# Patient Record
Sex: Female | Born: 1975 | Race: White | Hispanic: Yes | Smoking: Never smoker
Health system: Southern US, Community
[De-identification: ages and names within clinical notes are randomized; demographics above are authoritative.]

## PROBLEM LIST (undated history)

## (undated) HISTORY — PX: APPENDECTOMY: SHX54

---

## 2007-02-05 ENCOUNTER — Ambulatory Visit: Payer: Self-pay | Admitting: Internal Medicine

## 2007-02-07 ENCOUNTER — Ambulatory Visit (HOSPITAL_COMMUNITY): Admission: RE | Admit: 2007-02-07 | Discharge: 2007-02-07 | Payer: Self-pay | Admitting: *Deleted

## 2007-02-07 ENCOUNTER — Ambulatory Visit: Payer: Self-pay | Admitting: *Deleted

## 2007-03-22 ENCOUNTER — Ambulatory Visit: Payer: Self-pay | Admitting: Internal Medicine

## 2007-04-02 ENCOUNTER — Encounter (INDEPENDENT_AMBULATORY_CARE_PROVIDER_SITE_OTHER): Payer: Self-pay | Admitting: Internal Medicine

## 2007-04-02 LAB — CONVERTED CEMR LAB

## 2007-04-08 ENCOUNTER — Ambulatory Visit: Payer: Self-pay | Admitting: Internal Medicine

## 2007-04-09 ENCOUNTER — Ambulatory Visit: Payer: Self-pay | Admitting: Internal Medicine

## 2007-04-09 ENCOUNTER — Encounter (INDEPENDENT_AMBULATORY_CARE_PROVIDER_SITE_OTHER): Payer: Self-pay | Admitting: Internal Medicine

## 2007-04-26 ENCOUNTER — Ambulatory Visit (HOSPITAL_COMMUNITY): Admission: RE | Admit: 2007-04-26 | Discharge: 2007-04-26 | Payer: Self-pay | Admitting: Internal Medicine

## 2007-05-15 ENCOUNTER — Encounter (INDEPENDENT_AMBULATORY_CARE_PROVIDER_SITE_OTHER): Payer: Self-pay | Admitting: Nurse Practitioner

## 2007-05-15 ENCOUNTER — Ambulatory Visit: Payer: Self-pay | Admitting: Internal Medicine

## 2007-05-15 LAB — CONVERTED CEMR LAB
Free T4: 1.09 ng/dL (ref 0.89–1.80)
TSH: 4.995 microintl units/mL (ref 0.350–5.50)
Thyroperoxidase Ab SerPl-aCnc: 5315.7 — ABNORMAL HIGH (ref 0.0–60.0)

## 2007-05-21 ENCOUNTER — Encounter (INDEPENDENT_AMBULATORY_CARE_PROVIDER_SITE_OTHER): Payer: Self-pay | Admitting: Internal Medicine

## 2007-05-21 DIAGNOSIS — E039 Hypothyroidism, unspecified: Secondary | ICD-10-CM | POA: Insufficient documentation

## 2007-05-22 ENCOUNTER — Telehealth (INDEPENDENT_AMBULATORY_CARE_PROVIDER_SITE_OTHER): Payer: Self-pay | Admitting: Internal Medicine

## 2007-06-07 ENCOUNTER — Telehealth (INDEPENDENT_AMBULATORY_CARE_PROVIDER_SITE_OTHER): Payer: Self-pay | Admitting: Internal Medicine

## 2007-06-19 ENCOUNTER — Ambulatory Visit: Payer: Self-pay | Admitting: Obstetrics and Gynecology

## 2007-06-19 ENCOUNTER — Encounter (INDEPENDENT_AMBULATORY_CARE_PROVIDER_SITE_OTHER): Payer: Self-pay | Admitting: Internal Medicine

## 2008-02-27 ENCOUNTER — Ambulatory Visit: Payer: Self-pay | Admitting: Internal Medicine

## 2008-02-27 DIAGNOSIS — E049 Nontoxic goiter, unspecified: Secondary | ICD-10-CM | POA: Insufficient documentation

## 2008-02-27 DIAGNOSIS — N840 Polyp of corpus uteri: Secondary | ICD-10-CM

## 2008-03-03 LAB — CONVERTED CEMR LAB: TSH: 8.34 microintl units/mL — ABNORMAL HIGH (ref 0.350–5.50)

## 2008-07-07 ENCOUNTER — Ambulatory Visit: Payer: Self-pay | Admitting: Internal Medicine

## 2008-07-07 DIAGNOSIS — R109 Unspecified abdominal pain: Secondary | ICD-10-CM | POA: Insufficient documentation

## 2008-07-12 LAB — CONVERTED CEMR LAB
Chlamydia, DNA Probe: NEGATIVE
GC Probe Amp, Genital: NEGATIVE
TSH: 4.144 u[IU]/mL

## 2008-07-13 ENCOUNTER — Encounter (INDEPENDENT_AMBULATORY_CARE_PROVIDER_SITE_OTHER): Payer: Self-pay | Admitting: Internal Medicine

## 2008-08-04 ENCOUNTER — Encounter (INDEPENDENT_AMBULATORY_CARE_PROVIDER_SITE_OTHER): Payer: Self-pay | Admitting: *Deleted

## 2008-08-28 ENCOUNTER — Ambulatory Visit: Payer: Self-pay | Admitting: Internal Medicine

## 2008-08-28 DIAGNOSIS — F329 Major depressive disorder, single episode, unspecified: Secondary | ICD-10-CM | POA: Insufficient documentation

## 2008-08-28 LAB — CONVERTED CEMR LAB: TSH: 3.823 microintl units/mL (ref 0.350–4.50)

## 2008-09-10 ENCOUNTER — Encounter (INDEPENDENT_AMBULATORY_CARE_PROVIDER_SITE_OTHER): Payer: Self-pay | Admitting: Internal Medicine

## 2008-12-24 ENCOUNTER — Ambulatory Visit: Payer: Self-pay | Admitting: Internal Medicine

## 2009-01-15 LAB — CONVERTED CEMR LAB
Free T4: 1.07 ng/dL (ref 0.89–1.80)
TSH: 3.868 microintl units/mL (ref 0.350–4.500)

## 2009-05-25 ENCOUNTER — Telehealth (INDEPENDENT_AMBULATORY_CARE_PROVIDER_SITE_OTHER): Payer: Self-pay | Admitting: Internal Medicine

## 2009-08-13 ENCOUNTER — Emergency Department (HOSPITAL_COMMUNITY): Admission: EM | Admit: 2009-08-13 | Discharge: 2009-08-13 | Payer: Self-pay | Admitting: Family Medicine

## 2010-04-07 ENCOUNTER — Ambulatory Visit: Payer: Self-pay | Admitting: Internal Medicine

## 2010-04-07 LAB — CONVERTED CEMR LAB
ALT: 11 units/L (ref 0–35)
AST: 15 units/L (ref 0–37)
Alkaline Phosphatase: 56 units/L (ref 39–117)
BUN: 15 mg/dL (ref 6–23)
Basophils Absolute: 0 10*3/uL (ref 0.0–0.1)
Basophils Relative: 0 % (ref 0–1)
Blood in Urine, dipstick: NEGATIVE
Calcium: 9.1 mg/dL (ref 8.4–10.5)
Chlamydia, DNA Probe: NEGATIVE
Chloride: 103 meq/L (ref 96–112)
Creatinine, Ser: 0.72 mg/dL (ref 0.40–1.20)
Eosinophils Absolute: 0.1 10*3/uL (ref 0.0–0.7)
Eosinophils Relative: 2 % (ref 0–5)
HDL: 58 mg/dL (ref >39)
Hemoglobin: 12.7 g/dL (ref 12.0–15.0)
KOH Prep: NEGATIVE
MCHC: 33.1 g/dL (ref 30.0–36.0)
MCV: 92.1 fL (ref 78.0–100.0)
Monocytes Absolute: 0.9 10*3/uL (ref 0.1–1.0)
Monocytes Relative: 11 % (ref 3–12)
Neutro Abs: 4.6 10*3/uL (ref 1.7–7.7)
Nitrite: NEGATIVE
RBC: 4.17 M/uL (ref 3.87–5.11)
RDW: 12.9 % (ref 11.5–15.5)
TSH: 4.313 microintl units/mL (ref 0.350–4.500)
Total Bilirubin: 0.5 mg/dL (ref 0.3–1.2)
Total CHOL/HDL Ratio: 2.7
Urobilinogen, UA: 1
VLDL: 31 mg/dL (ref 0–40)
WBC Urine, dipstick: NEGATIVE
Whiff Test: NEGATIVE

## 2010-04-22 ENCOUNTER — Encounter (INDEPENDENT_AMBULATORY_CARE_PROVIDER_SITE_OTHER): Payer: Self-pay | Admitting: Internal Medicine

## 2010-10-16 ENCOUNTER — Encounter: Payer: Self-pay | Admitting: Internal Medicine

## 2010-10-25 NOTE — Progress Notes (Signed)
Summary: Office Visit/DEPRESSION SCREENING  Office Visit/DEPRESSION SCREENING   Imported By: Arta Bruce 04/08/2010 14:51:17  _____________________________________________________________________  External Attachment:    Type:   Image     Comment:   External Document

## 2010-10-25 NOTE — Assessment & Plan Note (Signed)
Summary: *CPP/////KT   Vital Signs:  Patient profile:   35 year old Margaret [name] LMP:     03/31/2010 Weight:      130 pounds BMI:     21.55 Temp:     98.1 degrees F Pulse rate:   77 / minute Pulse rhythm:   regular Resp:     18 per minute BP sitting:   107 / 68  (left arm) Cuff size:   regular  Vitals Entered By: Vesta Mixer CMA (April 07, 2010 2:28 PM) CC: CPP Is Patient Diabetic? No Pain Assessment Patient in pain? no       Does patient need assistance? Ambulation Normal LMP (date): 03/31/2010     Enter LMP: 03/31/2010 Last PAP Result Done   CC:  CPP.  History of Present Illness: 35 yo Margaret [name] here for CPP.  Concerns:  None  Allergies (verified): No Known Drug Allergies  Past History:  Past Medical History: Reviewed history from 05/21/2007 and no changes required. Hypothyroidism Ectopic Pregnancy 2006 Possible endometrial polyp by ultrasound 8/08-gyn apt 9/08 history Hashimoto's thyroiditis   Past Surgical History: 1. Salpingectomy/ovarian cystectomy 2006 2. Appendectomy 35 yrs old.  Family History: Mother, 41:  Gastritis, hypertension Father, 97:  Healthy 11 siblings:  one of sisters is diabetic. 2 children, ages 36 and Margaret yo:  both healthy  Social History: Married Lives at home with husband and 2 children Works at [employer]. Has lived in [place] years. Originally from [place] Tobacco:  None/never Drug:  None/never Alcohol:  none  Review of Systems General:  Energy is good. Eyes:  Previously used glasses--does not wear.. ENT:  Denies decreased hearing. CV:  Denies chest pain or discomfort and palpitations. Resp:  Denies shortness of breath. GI:  Denies abdominal pain, bloody stools, constipation, dark tarry stools, and diarrhea. GU:  Has had some vaginal itching, no discharge, some burning, no odor--3 months.  Has applied different creams--yeast infection creams--did help, but did not resolve issue Periods regular.   Not  using birth control. MS:  Denies joint pain, joint redness, and joint swelling. Derm:  Denies lesion(s) and rash. Neuro:  Denies numbness, tingling, and weakness. Psych:  Denies anxiety and depression; PHQ 9 scored 2.  Physical Exam  General:  Well-developed,well-nourished,in no acute distress; alert,appropriate and cooperative throughout examination Head:  Normocephalic and atraumatic without obvious abnormalities. No apparent alopecia or balding. Eyes:  No corneal or conjunctival inflammation noted. EOMI. Perrla. Funduscopic exam benign, without hemorrhages, exudates or papilledema. Vision grossly normal. Ears:  External ear exam shows no significant lesions or deformities.  Otoscopic examination reveals clear canals, tympanic membranes are intact bilaterally without bulging, retraction, inflammation or discharge. Hearing is grossly normal bilaterally. Nose:  External nasal examination shows no deformity or inflammation. Nasal mucosa are pink and moist without lesions or exudates. Mouth:  Oral mucosa and oropharynx without lesions or exudates.  Teeth in good repair. Neck:  No deformities, masses, or tenderness noted.thyromegaly.   Chest Wall:  No deformities, masses, or tenderness noted. Breasts:  No mass, nodules, thickening, tenderness, bulging, retraction, inflamation, nipple discharge or skin changes noted.   Lungs:  Normal respiratory effort, chest expands symmetrically. Lungs are clear to auscultation, no crackles or wheezes. Heart:  Normal rate and regular rhythm. S1 and S2 normal without gallop, murmur, click, rub or other extra sounds. Abdomen:  Bowel sounds positive,abdomen soft and non-tender without masses, organomegaly or hernias noted. Genitalia:  Pelvic Exam:        External: normal Margaret [name]  genitalia without lesions or masses        Vagina: normal without lesions or masses        Cervix: normal without lesions or masses        Adnexa: normal bimanual exam without masses or  fullness        Uterus: normal by palpation        Pap smear: performed Msk:  No deformity or scoliosis noted of thoracic or lumbar spine.   Pulses:  R and L carotid,radial,femoral,dorsalis pedis and posterior tibial pulses are full and equal bilaterally Extremities:  No clubbing, cyanosis, edema, or deformity noted with normal full range of motion of all joints.   Neurologic:  No cranial nerve deficits noted. Station and gait are normal. Plantar reflexes are down-going bilaterally. DTRs are symmetrical throughout. Sensory, motor and coordinative functions appear intact. Skin:  Intact without suspicious lesions or rashes Cervical Nodes:  No lymphadenopathy noted Axillary Nodes:  No palpable lymphadenopathy Inguinal Nodes:  No significant adenopathy Psych:  Cognition and judgment appear intact. Alert and cooperative with normal attention span and concentration. No apparent delusions, illusions, hallucinations   Impression & Recommendations:  Problem # 1:  ROUTINE GYNECOLOGICAL EXAMINATION (ICD-V72.31)  Orders: UA Dipstick w/o Micro (manual) ([address]) KOH/ WET Mount [phone]) Pap Smear, Thin Prep ( Collection of) [phone]) T- GC Chlamydia (91478) T-HIV Antibody  (Reflex) ([id number]) T-Pap Smear, Thin Prep (57846) T-Syphilis Test (RPR) (96295-28413)  Problem # 2:  HEALTH SCREENING (ICD-V70.0) Encouraged 1 more serving of dairy daily Tdap today Orders: T-Comprehensive Metabolic Panel [phone]) T-CBC w/Diff ([id number]) T-Lipid Profile (25956-38756)  Problem # 3:  THYROMEGALY (ICD-240.9)  Orders: T-TSH ([id number])  Other Orders: State-TD Vaccine 7 yrs. & > IM (16606T) Admin 1st Vaccine (01601) Admin 1st Vaccine [facility]) (09323F)  Patient Instructions: 1)  Call for CPP ibn 1 year with Dr. Delrae Alfred  Preventive Care Screening  Prior Values:    Pap Smear:  Done (04/02/2007)     Immunizations:  cannot recall last Td. TDD:UKGURKY--HC changes Mammogram:   never Osteoprevention:  3 servings of milk and yogurt daily.  Feels she is physically active daily   Laboratory Results   Urine Tests    Routine Urinalysis   Glucose: negative   (Normal Range: Negative) Bilirubin: negative   (Normal Range: Negative) Ketone: trace (5)   (Normal Range: Negative) Spec. Gravity: 1.020   (Normal Range: 1.003-1.035) Blood: negative   (Normal Range: Negative) pH: 6.0   (Normal Range: 5.0-8.0) Protein: negative   (Normal Range: Negative) Urobilinogen: 1.0   (Normal Range: 0-1) Nitrite: negative   (Normal Range: Negative) Leukocyte Esterace: negative   (Normal Range: Negative)      Wet Mount/KOH Source: vaginal WBC/hpf: 1-5 Bacteria/hpf: 1+ Clue cells/hpf: few  Negative whiff Yeast/hpf: none Trichomonas/hpf: none   Laboratory Results   Urine Tests    Routine Urinalysis   Glucose: negative   (Normal Range: Negative) Bilirubin: negative   (Normal Range: Negative) Ketone: trace (5)   (Normal Range: Negative) Spec. Gravity: 1.020   (Normal Range: 1.003-1.035) Blood: negative   (Normal Range: Negative) pH: 6.0   (Normal Range: 5.0-8.0) Protein: negative   (Normal Range: Negative) Urobilinogen: 1.0   (Normal Range: 0-1) Nitrite: negative   (Normal Range: Negative) Leukocyte Esterace: negative   (Normal Range: Negative)      Wet Mount  Negative whiff Wet Mount KOH: Negative     Tetanus/Td Vaccine    Vaccine Type: Tdap [facility])    Site: right deltoid  Mfr: Sanofi Pasteur    Dose: 0.5 ml    Route: IM    Given by: Vesta Mixer CMA    Exp. Date: 09/07/2012    Lot #: [id number]    VIS given: 08/13/07 version given April 07, 2010.

## 2010-10-25 NOTE — Letter (Signed)
Summary: Lipid Letter  [facility]  [address] [place], [place] [address]   Phone: [phone]  Fax: [phone]    04/22/2010  Margaret [name] [address] [place], [place]  [address]  Dear Byrd Hesselbach:  We have carefully reviewed your last lipid profile from 04/07/2010 and the results are noted below with a summary of recommendations for lipid management.    Cholesterol:       154     Goal: <200   HDL "good" Cholesterol:   58     Goal: >45   LDL "bad" Cholesterol:   65     Goal: <100   Triglycerides:       153     Goal: <150    Cholesterol is fine, thyroid testing fine.  Pap was normal.  REst of labs okay too    TLC Diet (Therapeutic Lifestyle Change): Saturated Fats & Transfatty acids should be kept < 7% of total calories ***Reduce Saturated Fats Polyunstaurated Fat can be up to 10% of total calories Monounsaturated Fat Fat can be up to 20% of total calories Total Fat should be no greater than 25-35% of total calories Carbohydrates should be 50-60% of total calories Protein should be approximately 15% of total calories Fiber should be at least 20-30 grams a day ***Increased fiber may help lower LDL Total Cholesterol should be < 200mg /day Consider adding plant stanol/sterols to diet (example: Benacol spread) ***A higher intake of unsaturated fat may reduce Triglycerides and Increase HDL    Adjunctive Measures (may lower LIPIDS and reduce risk of Heart Attack) include: Aerobic Exercise (20-30 minutes 3-4 times a week) Limit Alcohol Consumption Weight Reduction Aspirin 75-81 mg a day by mouth (if not allergic or contraindicated) Dietary Fiber 20-30 grams a day by mouth     Current Medications:  None If you have any questions, please call. We appreciate being able to work with you.   Sincerely,    [facility] Julieanne Manson MD

## 2013-04-11 ENCOUNTER — Ambulatory Visit: Payer: Self-pay

## 2013-05-12 ENCOUNTER — Ambulatory Visit: Payer: Self-pay | Attending: Internal Medicine

## 2013-05-22 ENCOUNTER — Other Ambulatory Visit (HOSPITAL_COMMUNITY)
Admission: RE | Admit: 2013-05-22 | Discharge: 2013-05-22 | Disposition: A | Discharge status: Home / Self Care | Payer: No Typology Code available for payment source | Source: Ambulatory Visit | Attending: Internal Medicine | Admitting: Internal Medicine

## 2013-05-22 ENCOUNTER — Ambulatory Visit: Payer: No Typology Code available for payment source | Attending: Internal Medicine | Admitting: Internal Medicine

## 2013-05-22 VITALS — BP 114/57 | HR 84 | Temp 97.8°F | Resp 17 | Wt 133.0 lb

## 2013-05-22 DIAGNOSIS — Z1151 Encounter for screening for human papillomavirus (HPV): Secondary | ICD-10-CM | POA: Insufficient documentation

## 2013-05-22 DIAGNOSIS — Z0000 Encounter for general adult medical examination without abnormal findings: Secondary | ICD-10-CM

## 2013-05-22 DIAGNOSIS — Z113 Encounter for screening for infections with a predominantly sexual mode of transmission: Secondary | ICD-10-CM | POA: Insufficient documentation

## 2013-05-22 DIAGNOSIS — Z01419 Encounter for gynecological examination (general) (routine) without abnormal findings: Secondary | ICD-10-CM | POA: Insufficient documentation

## 2013-05-22 DIAGNOSIS — N946 Dysmenorrhea, unspecified: Secondary | ICD-10-CM

## 2013-05-22 DIAGNOSIS — N760 Acute vaginitis: Secondary | ICD-10-CM | POA: Insufficient documentation

## 2013-05-22 DIAGNOSIS — Z124 Encounter for screening for malignant neoplasm of cervix: Secondary | ICD-10-CM

## 2013-05-22 LAB — CBC WITH DIFFERENTIAL/PLATELET
Basophils Absolute: 0 10*3/uL (ref 0.0–0.1)
HCT: 37.1 % (ref 36.0–46.0)
Lymphocytes Relative: 29 % (ref 12–46)
Lymphs Abs: 1.6 10*3/uL (ref 0.7–4.0)
Neutro Abs: 3.1 10*3/uL (ref 1.7–7.7)
Platelets: 234 10*3/uL (ref 150–400)
RBC: 4.19 MIL/uL (ref 3.87–5.11)
RDW: 13.9 % (ref 11.5–15.5)
WBC: 5.5 10*3/uL (ref 4.0–10.5)

## 2013-05-22 LAB — LIPID PANEL
HDL: 51 mg/dL (ref >39)
LDL Cholesterol: 69 mg/dL (ref 0–99)
Total CHOL/HDL Ratio: 2.9 Ratio
VLDL: 29 mg/dL (ref 0–40)

## 2013-05-22 LAB — CMP AND LIVER
Albumin: 4.4 g/dL (ref 3.5–5.2)
Alkaline Phosphatase: 64 U/L (ref 39–117)
BUN: 13 mg/dL (ref 6–23)
Calcium: 9.2 mg/dL (ref 8.4–10.5)
Creat: 0.74 mg/dL (ref 0.50–1.10)
Glucose, Bld: 83 mg/dL (ref 70–99)
Indirect Bilirubin: 0.4 mg/dL (ref 0.0–0.9)
Potassium: 4.1 mEq/L (ref 3.5–5.3)

## 2013-05-22 NOTE — Progress Notes (Signed)
Patient here to establish  Care and to get a pap smear

## 2013-05-22 NOTE — Addendum Note (Signed)
Addended by: Lestine Mount on: 05/22/2013 02:08 PM   Modules accepted: Orders

## 2013-05-22 NOTE — Progress Notes (Signed)
Patient ID: Margaret [name], female   DOB: 12-31-1975, 37 y.o.   MRN: [id number]  CC: To Establish care  HPI: Margaret [name] is a 37 years old woman who came in today to establish medical care. She has no complaint today, she just wants a general physical checkup and Pap smear done. No significant family history except for hypertension in mother. She does not smoke cigarette, she does not drink alcohol.   No Known Allergies History reviewed. No pertinent past medical history. No current outpatient prescriptions on file prior to visit.   No current facility-administered medications on file prior to visit.   History reviewed. No pertinent family history. History   Social History  . Marital Status: Married    Spouse Name: N/A    Number of Children: N/A  . Years of Education: N/A   Occupational History  . Not on file.   Social History Main Topics  . Smoking status: Not on file  . Smokeless tobacco: Not on file  . Alcohol Use: Not on file  . Drug Use: Not on file  . Sexual Activity: Not on file   Other Topics Concern  . Not on file   Social History Narrative  . No narrative on file    Review of Systems: Constitutional: Negative for fever, chills, diaphoresis, activity change, appetite change and fatigue. HENT: Negative for ear pain, nosebleeds, congestion, facial swelling, rhinorrhea, neck pain, neck stiffness and ear discharge.  Eyes: Negative for pain, discharge, redness, itching and visual disturbance. Respiratory: Negative for cough, choking, chest tightness, shortness of breath, wheezing and stridor.  Cardiovascular: Negative for chest pain, palpitations and leg swelling. Gastrointestinal: Negative for abdominal distention. Genitourinary: Negative for dysuria, urgency, frequency, hematuria, flank pain, decreased urine volume, difficulty urinating and dyspareunia.  Musculoskeletal: Negative for back pain, joint swelling, arthralgias and gait problem. Neurological: Negative for  dizziness, tremors, seizures, syncope, facial asymmetry, speech difficulty, weakness, light-headedness, numbness and headaches.  Hematological: Negative for adenopathy. Does not bruise/bleed easily. Psychiatric/Behavioral: Negative for hallucinations, behavioral problems, confusion, dysphoric mood, decreased concentration and agitation.    Objective:   Filed Vitals:   05/22/13 1029  BP: 114/57  Pulse: 84  Temp: 97.8 F (36.6 C)  Resp: 17    Physical Exam: Constitutional: Patient appears well-developed and well-nourished. No distress. HENT: Normocephalic, atraumatic, External right and left ear normal. Oropharynx is clear and moist.  Eyes: Conjunctivae and EOM are normal. PERRLA, no scleral icterus. Neck: Normal ROM. Neck supple. No JVD. No tracheal deviation. No thyromegaly. CVS: RRR, S1/S2 +, no murmurs, no gallops, no carotid bruit.  Pulmonary: Effort and breath sounds normal, no stridor, rhonchi, wheezes, rales.  Abdominal: Soft. BS +,  no distension, tenderness, rebound or guarding.  Musculoskeletal: Normal range of motion. No edema and no tenderness.  Lymphadenopathy: No lymphadenopathy noted, cervical, inguinal or axillary Neuro: Alert. Normal reflexes, muscle tone coordination. No cranial nerve deficit. Skin: Skin is warm and dry. No rash noted. Not diaphoretic. No erythema. No pallor. Psychiatric: Normal mood and affect. Behavior, judgment, thought content normal. Pelvic exam: Normal external female genitalia, slightly bulky uterus, central cervix, no cervical motion tenderness Lab Results  Component Value Date   WBC 7.5 04/07/2010   HGB 12.7 04/07/2010   HCT 38.4 04/07/2010   MCV 92.1 04/07/2010   PLT 219 04/07/2010   Lab Results  Component Value Date   CREATININE 0.72 04/07/2010   BUN 15 04/07/2010   NA 138 04/07/2010   K 4.2 04/07/2010   CL 103 04/07/2010  CO2 28 04/07/2010    No results found for this basename: HGBA1C   Lipid Panel     Component Value Date/Time    CHOL 154 04/07/2010 2132   TRIG 153* 04/07/2010 2132   HDL 58 04/07/2010 2132   CHOLHDL 2.7 Ratio 04/07/2010 2132   VLDL 31 04/07/2010 2132   LDLCALC 65 04/07/2010 2132       Assessment and plan:   Patient Active Problem List   Diagnosis Date Noted  . Pap smear for cervical cancer screening 05/22/2013  . Dysmenorrhea 05/22/2013  . Annual physical exam 05/22/2013  . DEPRESSION 08/28/2008  . PELVIC PAIN, RIGHT 07/07/2008  . THYROMEGALY 02/27/2008  . ENDOMETRIAL POLYP 02/27/2008  . HYPOTHYROIDISM 05/21/2007   Labs: CBC D Comprehensive metabolic TSH Complete urinalysis Lipid panel  Pap smear done, sent for cytology GC Chlamydia, wet mount     Return in 2 weeks in the clinic to discuss results  Graysen Depaula was given clear instructions to go to ER or return to the clinic if symptoms don't improve, worsen or new problems develop.  Scotty Court verbalized understanding.  Kendalyn Cranfield was told to call to get lab results if hasn't heard anything in the next week.    Interpreter was used to communicate directly with patient for the entire encounter including providing detailed patient instructions.   Jeanann Lewandowsky, MD [facility] [place], [place] [phone]   05/22/2013, 11:19 AM

## 2013-05-23 LAB — URINALYSIS, COMPLETE
Crystals: NONE SEEN
Leukocytes, UA: NEGATIVE
Nitrite: NEGATIVE
Specific Gravity, Urine: 1.023 (ref 1.005–1.030)
Squamous Epithelial / LPF: NONE SEEN
Urobilinogen, UA: 0.2 mg/dL (ref 0.0–1.0)

## 2013-05-23 LAB — TSH: TSH: 5.111 u[IU]/mL — ABNORMAL HIGH (ref 0.350–4.500)

## 2013-05-30 ENCOUNTER — Telehealth: Payer: Self-pay | Admitting: Emergency Medicine

## 2013-05-30 NOTE — Telephone Encounter (Signed)
Message copied by Darlis Loan on Fri May 30, 2013 12:09 PM ------      Message from: Jeanann Lewandowsky E      Created: Wed May 28, 2013 10:19 AM       Please call patient to inform her that her Pap Smear is negative for malignancy and there is no infection. She still has lab findings of hypothyroidism and should continue his medication. If there is any symptom, please report to the clinic immediately. Her lipid panel is within normal limit. Continue diet control and exercise. ------

## 2013-05-30 NOTE — Telephone Encounter (Signed)
LEFT MESSAGETO CALL CLINIC FOR LAB RESULTS

## 2013-06-05 ENCOUNTER — Ambulatory Visit: Payer: No Typology Code available for payment source

## 2013-10-08 ENCOUNTER — Ambulatory Visit: Payer: No Typology Code available for payment source | Attending: Internal Medicine | Admitting: Internal Medicine

## 2013-10-08 ENCOUNTER — Encounter: Payer: Self-pay | Admitting: Internal Medicine

## 2013-10-08 VITALS — BP 113/69 | HR 80 | Temp 98.9°F | Resp 16 | Ht 65.0 in | Wt 134.0 lb

## 2013-10-08 DIAGNOSIS — N390 Urinary tract infection, site not specified: Secondary | ICD-10-CM

## 2013-10-08 DIAGNOSIS — N750 Cyst of Bartholin's gland: Secondary | ICD-10-CM

## 2013-10-08 LAB — POCT URINALYSIS DIPSTICK
BILIRUBIN UA: NEGATIVE
Glucose, UA: NEGATIVE
Ketones, UA: NEGATIVE
NITRITE UA: NEGATIVE
PH UA: 6.5
Protein, UA: NEGATIVE
Spec Grav, UA: 1.025
Urobilinogen, UA: 0.2

## 2013-10-08 MED ORDER — SULFAMETHOXAZOLE-TMP DS 800-160 MG PO TABS: 1.0000 | ORAL_TABLET | Freq: Two times a day (BID) | ORAL | Status: DC

## 2013-10-08 NOTE — Patient Instructions (Signed)
Absceso o quiste de Bartolino  (Bartholin's Cyst or Abscess)  Las glándulas de Bartolino son glándulas pequeñas ubicadas dentro de los pliegues de la piel (labios) a los lados de la apertura de la vagina (canal del parto). Cuando el conducto de la glándula se obstruye, puede desarrollarse un quiste. Cuando esto ocurre, el líquido que se acumula dentro del quiste puede llegar a infectarse. Esto se conoce como absceso. La glándula de Bartolino produce una mucosidad líquida en la parte externa de la vagina durante las relaciones sexuales.  SÍNTOMAS  · Los pacientes que presentan un quiste pequeño no tienen problemas.  · Podrá sentir desde una leve molestia a un dolor intenso según el tamaño del quiste y si existe infección o no,  · Sentirá dolor, inflamación e hinchazón en la zona inferior de la vagina.  · Dolor en las relaciones sexuales.  · Presión en las zona del perineo.  · Hinchazón de los labios de la vagina.  · El quiste puede estar en uno o ambos lados de la vagina.  DIAGNÓSTICO  · El profesional podrá observar una gran zona hinchada en la parte inferior de la vagina.  · Es una zona dolorosa al tacto.  · Si se trata de un absceso habrá inflamación y dolor.  TRATAMIENTO  · En algunos casos el quiste desaparecerá sin tratamiento.  · Aplique compresas tibias húmedas en la zona o tome baños de asiento varias veces al día.  · Le practicarán una incisión para drenar el quiste o el absceso, previa aplicación de anestesia local.  · Si se trata de un absceso le indicarán un cultivo del pus.  · Y en ese caso le prescribirán un tratamiento con antibióticos.  · Se realizará una abertura en la glándula, suturando los bordes para hacer la abertura más grande (marsupialización).  · Si aparece nuevamente el quiste o el absceso, le extirparán toda la glándula.  PREVENCIÓN  · Mantenga una buena higiene.  · Higienice la zona vaginal con jabón neutro y un paño suave.  · No frote la zona al darse un baño.  · Proteja la zona de la  entrepierna con un apósito si realiza largos paseos en bicicleta o a caballo.  · Asegúrese de estar bien lubricada cuando mantenga relaciones sexuales.  INSTRUCCIONES PARA EL CUIDADO DOMICILIARIO  · Si su quiste o absceso ha sido abierto, pudieran haberle colocado un pequeño trozo de gasa o un drenaje para permitir que la herida supure. La gasa o el drenaje a menos que se lo indique el profesional que le asiste.  · Use toallas femeninas y no tampones cuando lo necesite en caso de drenaje o sangrado.  · Si le han recetado medicamentos que combaten los gérmenes (antibióticos ), tómelos exactamente de la manera que le haya sido indicada. Asegúrese de terminar con todo el ciclo de antibióticos.  · Utilice los medicamentos de venta libre o de prescripción para el dolor, el malestar o la fiebre, según se lo indique el profesional que lo asiste.  SOLICITE ATENCIÓN MÉDICA DE INMEDIATO SI:  · Aumenta el dolor, el enrojecimiento, la hinchazón o la supuración.  · La herida ha sangrado al punto que ha debido usar más apósitos de los que la cantidad de apósitos sugerida por el médico en 24 horas.  · Siente escalofríos.  · Tiene fiebre.  · Tiene algún problema (síntoma) nuevo o se agravan lo ya existentes.  ESTÉ SEGURO QUE:  · Comprende las instrucciones para el alta médica.  · Controlará su 

## 2013-10-08 NOTE — Progress Notes (Signed)
Patient ID: Margaret [name], female   DOB: 1976/07/17, 38 y.o.   MRN: [id number]  CC: ? cyst  HPI: 38 year old female with no significant past medical history who presented to clinic for evaluation of questionable cyst/abscess on labia majora. Patient reports in [place] she had a laser surgery to remove it couple of years ago. She usually notices data with regular menstruation because of. She doesn't particularly complain of burning sensation on urination. She does feel some pain in that area. No fevers or chills  No Known Allergies History reviewed. No pertinent past medical history. No current outpatient prescriptions on file prior to visit.   No current facility-administered medications on file prior to visit.   History reviewed. No pertinent family history. History   Social History  . Marital Status: Married    Spouse Name: N/A    Number of Children: N/A  . Years of Education: N/A   Occupational History  . Not on file.   Social History Main Topics  . Smoking status: Never Smoker   . Smokeless tobacco: Not on file  . Alcohol Use: No  . Drug Use: No  . Sexual Activity: Yes   Other Topics Concern  . Not on file   Social History Narrative  . No narrative on file    Review of Systems  Constitutional: Negative for fever, chills, diaphoresis, activity change, appetite change and fatigue.  HENT: Negative for ear pain, nosebleeds, congestion, facial swelling, rhinorrhea, neck pain, neck stiffness and ear discharge.   Eyes: Negative for pain, discharge, redness, itching and visual disturbance.  Respiratory: Negative for cough, choking, chest tightness, shortness of breath, wheezing and stridor.   Cardiovascular: Negative for chest pain, palpitations and leg swelling.  Gastrointestinal: Negative for abdominal distention.  Genitourinary: Has a small questionable abscess on lanbia majora Musculoskeletal: Negative for back pain, joint swelling, arthralgias and gait problem.   Neurological: Negative for dizziness, tremors, seizures, syncope, facial asymmetry, speech difficulty, weakness, light-headedness, numbness and headaches.  Hematological: Negative for adenopathy. Does not bruise/bleed easily.  Psychiatric/Behavioral: Negative for hallucinations, behavioral problems, confusion, dysphoric mood, decreased concentration and agitation.    Objective:   Filed Vitals:   10/08/13 1311  BP: 113/69  Pulse: 80  Temp: 98.9 F (37.2 C)  Resp: 16    Physical Exam  Constitutional: Appears well-developed and well-nourished. No distress.  HENT: Normocephalic. External right and left ear normal. Oropharynx is clear and moist.  Eyes: Conjunctivae and EOM are normal. PERRLA, no scleral icterus.  Neck: Normal ROM. Neck supple. No JVD. No tracheal deviation. No thyromegaly.  CVS: RRR, S1/S2 +, no murmurs, no gallops, no carotid bruit.  Pulmonary: Effort and breath sounds normal, no stridor, rhonchi, wheezes, rales.  Abdominal: Soft. BS +,  no distension, tenderness, rebound or guarding.  Musculoskeletal: Normal range of motion. No edema and no tenderness.  Lymphadenopathy: No lymphadenopathy noted, cervical, inguinal. Neuro: Alert. Normal reflexes, muscle tone coordination. No cranial nerve deficit. Skin: Skin is warm and dry. No rash noted. Not diaphoretic. No erythema. No pallor.  Psychiatric: Normal mood and affect. Behavior, judgment, thought content normal.   Lab Results  Component Value Date   WBC 5.5 05/22/2013   HGB 12.6 05/22/2013   HCT 37.1 05/22/2013   MCV 88.5 05/22/2013   PLT 234 05/22/2013   Lab Results  Component Value Date   CREATININE 0.74 05/22/2013   BUN 13 05/22/2013   NA 140 05/22/2013   K 4.1 05/22/2013   CL 107 05/22/2013   CO2  25 05/22/2013    No results found for this basename: HGBA1C   Lipid Panel     Component Value Date/Time   CHOL 149 05/22/2013 1133   TRIG 144 05/22/2013 1133   HDL 51 05/22/2013 1133   CHOLHDL 2.9 05/22/2013 1133    VLDL 29 05/22/2013 1133   LDLCALC 69 05/22/2013 1133       Assessment and plan:   Patient Active Problem List   Diagnosis Date Noted  . Bartholin cyst 10/08/2013    Priority: Medium - Questionable Bartholin's cyst  - Referral made to GYN  - Given prescription for Bactrim for possible UTI

## 2013-10-08 NOTE — Progress Notes (Signed)
Pt C.C. She has had a UTI for 3 weeks.

## 2013-11-17 ENCOUNTER — Encounter: Payer: Self-pay | Admitting: Obstetrics & Gynecology

## 2014-04-21 ENCOUNTER — Emergency Department (HOSPITAL_COMMUNITY): Payer: Self-pay

## 2014-04-21 ENCOUNTER — Encounter (HOSPITAL_COMMUNITY): Payer: Self-pay | Admitting: Emergency Medicine

## 2014-04-21 ENCOUNTER — Emergency Department (HOSPITAL_COMMUNITY)
Admission: EM | Admit: 2014-04-21 | Discharge: 2014-04-21 | Disposition: A | Discharge status: Home / Self Care | Payer: Self-pay | Attending: Emergency Medicine | Admitting: Emergency Medicine

## 2014-04-21 DIAGNOSIS — Y92009 Unspecified place in unspecified non-institutional (private) residence as the place of occurrence of the external cause: Secondary | ICD-10-CM | POA: Insufficient documentation

## 2014-04-21 DIAGNOSIS — S335XXA Sprain of ligaments of lumbar spine, initial encounter: Secondary | ICD-10-CM | POA: Insufficient documentation

## 2014-04-21 DIAGNOSIS — S39012A Strain of muscle, fascia and tendon of lower back, initial encounter: Secondary | ICD-10-CM

## 2014-04-21 DIAGNOSIS — IMO0002 Reserved for concepts with insufficient information to code with codable children: Secondary | ICD-10-CM | POA: Insufficient documentation

## 2014-04-21 DIAGNOSIS — X58XXXA Exposure to other specified factors, initial encounter: Secondary | ICD-10-CM | POA: Insufficient documentation

## 2014-04-21 DIAGNOSIS — Y9389 Activity, other specified: Secondary | ICD-10-CM | POA: Insufficient documentation

## 2014-04-21 DIAGNOSIS — Z3202 Encounter for pregnancy test, result negative: Secondary | ICD-10-CM | POA: Insufficient documentation

## 2014-04-21 DIAGNOSIS — Z79899 Other long term (current) drug therapy: Secondary | ICD-10-CM | POA: Insufficient documentation

## 2014-04-21 LAB — URINALYSIS, ROUTINE W REFLEX MICROSCOPIC
BILIRUBIN URINE: NEGATIVE
Glucose, UA: NEGATIVE mg/dL
HGB URINE DIPSTICK: NEGATIVE
Ketones, ur: NEGATIVE mg/dL
Nitrite: NEGATIVE
Protein, ur: NEGATIVE mg/dL
SPECIFIC GRAVITY, URINE: 1.007 (ref 1.005–1.030)
UROBILINOGEN UA: 0.2 mg/dL (ref 0.0–1.0)
pH: 7.5 (ref 5.0–8.0)

## 2014-04-21 LAB — URINE MICROSCOPIC-ADD ON

## 2014-04-21 LAB — PREGNANCY, URINE: Preg Test, Ur: NEGATIVE

## 2014-04-21 MED ORDER — CYCLOBENZAPRINE HCL 10 MG PO TABS: 10.0000 mg | ORAL_TABLET | Freq: Two times a day (BID) | ORAL | Status: DC | PRN

## 2014-04-21 MED ORDER — DIAZEPAM 5 MG PO TABS
5.0000 mg | ORAL_TABLET | Freq: Once | ORAL | Status: AC
  Administered 2014-04-21: 5 mg via ORAL
  Filled 2014-04-21: qty 1

## 2014-04-21 MED ORDER — HYDROCODONE-ACETAMINOPHEN 5-325 MG PO TABS: 2.0000 | ORAL_TABLET | ORAL | Status: DC | PRN

## 2014-04-21 MED ORDER — KETOROLAC TROMETHAMINE 60 MG/2ML IM SOLN
60.0000 mg | Freq: Once | INTRAMUSCULAR | Status: AC
  Administered 2014-04-21: 60 mg via INTRAMUSCULAR
  Filled 2014-04-21: qty 2

## 2014-04-21 NOTE — Discharge Instructions (Signed)
Take Vicodin for pain. Take Flexeril for muscle spasm. Refer to attached documents for more information. You may take the medications together.

## 2014-04-21 NOTE — ED Notes (Signed)
Declined W/C at D/C and was escorted to lobby by RN. 

## 2014-04-21 NOTE — Discharge Planning (Signed)
[place] to patient regarding primary care resources and establishing care with a provider. Patient states she is seen by [facility] at [facility] and her orange card has recently expired. Pts family at bedside states the pt has an upcoming appointment at the practice. Resource guide and my contact information provided for future questions or concerns. No other needs identified at this time.

## 2014-04-21 NOTE — ED Notes (Signed)
Per family pt was cleaning her house yesterday and started having back pain after moving a table. sts pain also in right leg.

## 2014-04-21 NOTE — ED Notes (Signed)
Called radiology to advise that pregnancy test is back and negative.

## 2014-04-21 NOTE — ED Provider Notes (Signed)
CSN: [id number]     Arrival date & time 04/21/14  1249 History  This chart was scribed for Margaret Chou PA-C working with Hoy Morn, MD by Stacy Gardner, ED scribe. This patient was seen in room TR10C/TR10C and the patient's care was started at 1:57 PM.   First MD Initiated Contact with Patient 04/21/14 1306     Chief Complaint  Patient presents with  . Back Pain     (Consider location/radiation/quality/duration/timing/severity/associated sxs/prior Treatment) HPI HPI Comments: Margaret [name] is a 38 y.o. female who presents to the Emergency Department complaining of back pain, onset yesterday. Pt mentions yesterday while moving a table she had sharp right sided back pian. Pt vomited three day since onset. She had dysuria to her right suprapubic region. Pt took Advil this morning w/o improvement of symptoms. Pt reports when she moves her neck she has pain to her right hip.. Pt had an appendectomy. Denies diarrhea, fever, and chills. She had past back pain three years ago but this is more severe. Pt denies being pregnant.  History reviewed. No pertinent past medical history. Past Surgical History  Procedure Laterality Date  . Appendectomy     History reviewed. No pertinent family history. History  Substance Use Topics  . Smoking status: Never Smoker   . Smokeless tobacco: Not on file  . Alcohol Use: No   OB History   Grav Para Term Preterm Abortions TAB SAB Ect Mult Living                 Review of Systems  Constitutional: Negative for fever, chills and fatigue.  HENT: Negative for trouble swallowing.   Eyes: Negative for visual disturbance.  Respiratory: Negative for shortness of breath.   Cardiovascular: Negative for chest pain and palpitations.  Gastrointestinal: Negative for nausea, vomiting, abdominal pain and diarrhea.  Genitourinary: Negative for dysuria and difficulty urinating.  Musculoskeletal: Positive for back pain. Negative for arthralgias and neck  pain.  Skin: Negative for color change.  Neurological: Negative for dizziness and weakness.  Psychiatric/Behavioral: Negative for dysphoric mood.      Allergies  Review of patient's allergies indicates no known allergies.  Home Medications   Prior to Admission medications   Medication Sig Start Date End Date Taking? Authorizing Provider  ibuprofen (ADVIL,MOTRIN) 200 MG tablet Take 400 mg by mouth every 6 (six) hours as needed (pain).   Yes Historical Provider, MD   BP 109/71  Pulse 86  Temp(Src) 98.5 F (36.9 C) (Oral)  Resp 18  SpO2 97%  LMP 04/07/2014 Physical Exam  Nursing note and vitals reviewed. Constitutional: She is oriented to person, place, and time. She appears well-developed and well-nourished. No distress.  HENT:  Head: Normocephalic and atraumatic.  Eyes: Conjunctivae and EOM are normal.  Neck: Normal range of motion.  Cardiovascular: Normal rate and regular rhythm.  Exam reveals no gallop and no friction rub.   No murmur heard. Pulmonary/Chest: Effort normal and breath sounds normal. She has no wheezes. She has no rales. She exhibits no tenderness.  Abdominal: Soft. There is no tenderness.  Musculoskeletal: Normal range of motion.  No midline spine tenderness to palpation. Right lumbar paraspinal tenderness to palpation.   Neurological: She is alert and oriented to person, place, and time. Coordination normal.  Lower extremity strength and sensation equal and intact bilaterally. Speech is goal-oriented. Moves limbs without ataxia.   Skin: Skin is warm and dry.  Psychiatric: She has a normal mood and affect. Her behavior is normal.  ED Course  Procedures (including critical care time) DIAGNOSTIC STUDIES: Oxygen Saturation is 97% on room air, normal by my interpretation.    COORDINATION OF CARE:  1:56 PM Discussed course of care with pt . Pt understands and agrees.    Labs Review Labs Reviewed - No data to display  Imaging Review Dg Cervical  Spine Complete  04/21/2014   CLINICAL DATA:  Low back pain  EXAM: CERVICAL SPINE  4+ VIEWS  COMPARISON:  None.  FINDINGS: Anatomic alignment. No vertebral compression deformity. Unremarkable prevertebral soft tissues. Oblique views are limited but foramina are grossly patent. No obvious fracture. Disc height is maintained. Minimal posterior osteophytic ridging at the C4-5 and C5-6 discs.  IMPRESSION: No acute abnormality.   Electronically Signed   By: Maryclare Bean M.D.   On: 04/21/2014 16:13   Dg Lumbar Spine Complete  04/21/2014   CLINICAL DATA:  Low back pain for 2 days  EXAM: LUMBAR SPINE - COMPLETE 4+ VIEW  COMPARISON:  08/13/2009  FINDINGS: There is no evidence of lumbar spine fracture. Alignment is normal. Intervertebral disc spaces are maintained.  IMPRESSION: Stable exam.  No acute osseous finding.   Electronically Signed   By: Daryll Brod M.D.   On: 04/21/2014 16:10     EKG Interpretation None      MDM   Final diagnoses:  Lumbar strain, initial encounter    Patient's xrays unremarkable for acute changes. No bladder/bowel incontinence or saddle paresthesias. Vitals stable and patient afebrile. Patient reports relief with IM toradol and valium. Patient will be discharged with Flexeril and Vicodin for pain.   I personally performed the services described in this documentation, which was scribed in my presence. The recorded information has been reviewed and is accurate.    Margaret [name], [place] 04/22/14 [phone]

## 2014-04-27 NOTE — ED Provider Notes (Signed)
Medical screening examination/treatment/procedure(s) were performed by non-physician practitioner and as supervising physician I was immediately available for consultation/collaboration.   EKG Interpretation None        Hoy Morn, MD 04/27/14 2307

## 2014-08-17 ENCOUNTER — Telehealth: Payer: Self-pay | Admitting: Internal Medicine

## 2014-08-17 ENCOUNTER — Ambulatory Visit: Payer: Self-pay

## 2014-08-17 NOTE — Telephone Encounter (Signed)
Pt. Came into facility requesting results from ED visit in July, pt. States that they did not give her results at the hospital and told her to follow up with her PCP to request results. Please f/u with pt.

## 2014-08-19 NOTE — Telephone Encounter (Signed)
Expand All Collapse All   Pt. Came into facility requesting results from ED visit in July, pt. States that they did not give her results at the hospital and told her to follow up with her PCP to request results. Please f/u with pt.

## 2014-09-09 ENCOUNTER — Ambulatory Visit: Payer: Self-pay

## 2014-12-03 ENCOUNTER — Encounter: Payer: Self-pay | Admitting: Internal Medicine

## 2014-12-14 ENCOUNTER — Encounter: Payer: Self-pay | Admitting: Internal Medicine

## 2014-12-14 ENCOUNTER — Ambulatory Visit: Payer: Self-pay | Attending: Internal Medicine | Admitting: Internal Medicine

## 2014-12-14 VITALS — BP 123/74 | HR 78 | Temp 98.7°F | Resp 16 | Ht 65.0 in | Wt 135.0 lb

## 2014-12-14 DIAGNOSIS — Z124 Encounter for screening for malignant neoplasm of cervix: Secondary | ICD-10-CM

## 2014-12-14 DIAGNOSIS — Z0000 Encounter for general adult medical examination without abnormal findings: Secondary | ICD-10-CM

## 2014-12-14 DIAGNOSIS — N852 Hypertrophy of uterus: Secondary | ICD-10-CM

## 2014-12-14 LAB — CBC WITH DIFFERENTIAL/PLATELET
Basophils Absolute: 0 10*3/uL (ref 0.0–0.1)
Basophils Relative: 0 % (ref 0–1)
Eosinophils Absolute: 0.1 10*3/uL (ref 0.0–0.7)
Eosinophils Relative: 1 % (ref 0–5)
HCT: 38.8 % (ref 36.0–46.0)
Hemoglobin: 13 g/dL (ref 12.0–15.0)
Lymphocytes Relative: 28 % (ref 12–46)
Lymphs Abs: 2.4 10*3/uL (ref 0.7–4.0)
MCH: 28.9 pg (ref 26.0–34.0)
MCHC: 33.5 g/dL (ref 30.0–36.0)
MCV: 86.2 fL (ref 78.0–100.0)
MPV: 10.4 fL (ref 8.6–12.4)
Monocytes Absolute: 1.2 10*3/uL — ABNORMAL HIGH (ref 0.1–1.0)
Monocytes Relative: 14 % — ABNORMAL HIGH (ref 3–12)
Neutro Abs: 4.8 10*3/uL (ref 1.7–7.7)
Neutrophils Relative %: 57 % (ref 43–77)
Platelets: 237 10*3/uL (ref 150–400)
RBC: 4.5 MIL/uL (ref 3.87–5.11)
RDW: 14.1 % (ref 11.5–15.5)
WBC: 8.4 10*3/uL (ref 4.0–10.5)

## 2014-12-14 LAB — COMPLETE METABOLIC PANEL WITH GFR
ALT: 19 U/L (ref 0–35)
AST: 20 U/L (ref 0–37)
Albumin: 4.5 g/dL (ref 3.5–5.2)
Alkaline Phosphatase: 62 U/L (ref 39–117)
BUN: 14 mg/dL (ref 6–23)
CO2: 28 mEq/L (ref 19–32)
Calcium: 9 mg/dL (ref 8.4–10.5)
Chloride: 104 mEq/L (ref 96–112)
Creat: 0.65 mg/dL (ref 0.50–1.10)
GFR, Est African American: >89 mL/min
GFR, Est Non African American: >89 mL/min
Glucose, Bld: 87 mg/dL (ref 70–99)
Potassium: 3.4 mEq/L — ABNORMAL LOW (ref 3.5–5.3)
Sodium: 138 mEq/L (ref 135–145)
Total Bilirubin: 0.5 mg/dL (ref 0.2–1.2)
Total Protein: 7.3 g/dL (ref 6.0–8.3)

## 2014-12-14 LAB — LIPID PANEL
Cholesterol: 182 mg/dL (ref 0–200)
HDL: 68 mg/dL (ref >=46)
LDL Cholesterol: 93 mg/dL (ref 0–99)
Total CHOL/HDL Ratio: 2.7 Ratio
Triglycerides: 103 mg/dL (ref <150)
VLDL: 21 mg/dL (ref 0–40)

## 2014-12-14 NOTE — Patient Instructions (Signed)
Ejercicios para Technical sales engineer (Exercise to [employer]) La actividad fsica lo ayudar a estar y [place] sano. IDEAS Y CONSEJOS PARA HACER EJERCICIOS Elija ejercicios que:  Pueda disfrutar.  Pueda acomodarlos en su rutina. No necesita ejercitar demasiado para estar sano. Puede realizar [employer] a un ritmo lento o mediano y [employer]. Usted puede:  Elongar antes y despus de Chief Technology Officer.  Pruebe practicar yoga, Pilates o tai chi.  Levantar pesas.  Caminar rpido, nadar, trotar, correr, subir escaleras, andar en bicicleta, bailar o andar en rollers.  Tomar clases de [place]. Actividad fsicaque puede quemar alrededor de 150 caloras:  Correr 20 cuadras en 15 minutos.  Jugar vley durante 45 a 60 minutos.  Limpiar y encerar el auto durante 45 a 60 minutos.  Jugar ftbol americano de toque.  Caminar 25 cuadras en 35 minutos.  Empujar un cochecito 20 cuadras en 30 minutos.  Jugar baloncesto durante 30 minutos.  Rastrillar hojas secas durante 30 minutos.  Andar en bicicleta 80 cuadras en 30 minutos.  Caminar 30 cuadras en 30 minutos.  Bailar durante 30 minutos.  Quitar la nieve con una pala durante 15 minutos.  Nadar vigorosamente durante 20 minutos.  Subir escaleras durante 15 minutos.  Andar en bicicleta 60 cuadras durante 15 minutos.  Arreglar el jardn entre 30 y 82 minutos.  Saltar a la soga durante 15 minutos.  Limpiar vidrios o pisos durante 45 a 60 minutos. Document Released: 12/16/2010 Document Revised: 12/04/2011 [facility] Patient Information 2015 [place]. This information is not intended to replace advice given to you by your health care provider. Make sure you discuss any questions you have with your health care provider.

## 2014-12-14 NOTE — Progress Notes (Signed)
Pt is here for a physical and a pap smear.

## 2014-12-14 NOTE — Progress Notes (Deleted)
Interpreter line used Patient complains of right hip pain Patient states she has this a long time ago now the pain is back Patient states she has not trouble voiding

## 2014-12-14 NOTE — Progress Notes (Signed)
Patient ID: Margaret [name], female   DOB: 18-Jul-1976, 39 y.o.   MRN: [id number]   Margaret [name], is a 39 y.o. female  [id number]  [id number]  DOB - 07/15/1976  Chief Complaint  Patient presents with  . Follow-up        Subjective:   Margaret [name] is a 39 y.o. female here today for a follow up visit. Patient has no significant past medical history here today for annual physical examination. She has no complaint. Patient has No headache, No chest pain, No abdominal pain - No Nausea, No new weakness tingling or numbness, No Cough - SOB.  No problems updated.  ALLERGIES: No Known Allergies  PAST MEDICAL HISTORY: History reviewed. No pertinent past medical history.  MEDICATIONS AT HOME: Prior to Admission medications   Medication Sig Start Date End Date Taking? Authorizing Provider  cyclobenzaprine (FLEXERIL) 10 MG tablet Take 1 tablet (10 mg total) by mouth 2 (two) times daily as needed for muscle spasms. Patient not taking: Reported on 12/14/2014 04/21/14   Alvina Chou, PA-C  HYDROcodone-acetaminophen (NORCO/VICODIN) 5-325 MG per tablet Take 2 tablets by mouth every 4 (four) hours as needed for moderate pain or severe pain. Patient not taking: Reported on 12/14/2014 04/21/14   Alvina Chou, PA-C  ibuprofen (ADVIL,MOTRIN) 200 MG tablet Take 400 mg by mouth every 6 (six) hours as needed (pain).    Historical Provider, MD     Objective:   Filed Vitals:   12/14/14 1707  BP: 123/74  Pulse: 78  Temp: 98.7 F (37.1 C)  TempSrc: Oral  Resp: 16  Height: 5\' 5"  (1.651 m)  Weight: 135 lb (61.236 kg)  SpO2: 98%    Exam General appearance : Awake, alert, not in any distress. Speech Clear. Not toxic looking HEENT: Atraumatic and Normocephalic, pupils equally reactive to light and accomodation Neck: supple, no JVD. No cervical lymphadenopathy.  Chest:Good air entry bilaterally, no added sounds  CVS: S1 S2 regular, no murmurs.  Abdomen: Bowel sounds  present, Non tender and not distended with no gaurding, rigidity or rebound. Extremities: B/L Lower Ext shows no edema, both legs are warm to touch Neurology: Awake alert, and oriented X 3, CN II-XII intact, Non focal  Pelvic Exam: Cervix normal in appearance, external genitalia normal, no adnexal masses or tenderness, no cervical motion tenderness, rectovaginal septum normal, uterus is bulky, vagina normal without discharge    Data Review No results found for: HGBA1C   Assessment & Plan   1. Annual physical exam  - CBC with Differential/Platelet - COMPLETE METABOLIC PANEL WITH GFR - POCT glycosylated hemoglobin (Hb A1C) - Lipid panel - TSH - Urinalysis, Complete - HIV antibody (with reflex)  2. Pap smear for cervical cancer screening  - Cytology - PAP - Cervicovaginal ancillary only  3. Bulky or enlarged uterus  - US Pelvis Complete; Future - US Transvaginal Non-OB; Future  Patient have been counseled extensively about nutrition and exercise Return in about 1 year (around 12/14/2015), or if symptoms worsen or fail to improve, for Annual Physical.  The patient was given clear instructions to go to ER or return to medical center if symptoms don't improve, worsen or new problems develop. The patient verbalized understanding. The patient was told to call to get lab results if they haven't heard anything in the next week.   This note has been created with Surveyor, quantity. Any transcriptional errors are unintentional.    Margaret Berhow, MD, MHA, CPE, FACP, [place]  [facility] and [place], [place]   12/14/2014, 5:45 PM

## 2014-12-15 LAB — URINALYSIS, COMPLETE
BACTERIA UA: NONE SEEN
Bilirubin Urine: NEGATIVE
Casts: NONE SEEN
Crystals: NONE SEEN
Glucose, UA: NEGATIVE mg/dL
Hgb urine dipstick: NEGATIVE
Ketones, ur: NEGATIVE mg/dL
LEUKOCYTES UA: NEGATIVE
NITRITE: NEGATIVE
PROTEIN: NEGATIVE mg/dL
SPECIFIC GRAVITY, URINE: 1.022 (ref 1.005–1.030)
UROBILINOGEN UA: 1 mg/dL (ref 0.0–1.0)
pH: 7 (ref 5.0–8.0)

## 2014-12-15 LAB — TSH: TSH: 8.104 u[IU]/mL — AB (ref 0.350–4.500)

## 2014-12-15 LAB — HIV ANTIBODY (ROUTINE TESTING W REFLEX): HIV 1&2 Ab, 4th Generation: NONREACTIVE

## 2014-12-16 LAB — CERVICOVAGINAL ANCILLARY ONLY
Chlamydia: NEGATIVE
Neisseria Gonorrhea: NEGATIVE
WET PREP (BD AFFIRM): NEGATIVE
WET PREP (BD AFFIRM): NEGATIVE
Wet Prep (BD Affirm): POSITIVE — AB

## 2014-12-16 LAB — CYTOLOGY - PAP

## 2014-12-17 ENCOUNTER — Telehealth: Payer: Self-pay | Admitting: *Deleted

## 2014-12-17 NOTE — Telephone Encounter (Signed)
[place] appointment on 12/25/2014 at 11:30 arriving 15 min early Full Bladder Left voice message with appointmet Message left in Spanish

## 2014-12-25 ENCOUNTER — Ambulatory Visit (HOSPITAL_COMMUNITY)
Admission: RE | Admit: 2014-12-25 | Discharge: 2014-12-25 | Disposition: A | Discharge status: Home / Self Care | Payer: Self-pay | Source: Ambulatory Visit | Attending: Internal Medicine | Admitting: Internal Medicine

## 2014-12-25 DIAGNOSIS — N852 Hypertrophy of uterus: Secondary | ICD-10-CM | POA: Insufficient documentation

## 2015-01-01 ENCOUNTER — Telehealth: Payer: Self-pay

## 2015-01-01 MED ORDER — LEVOTHYROXINE SODIUM 75 MCG PO TABS: 75.0000 ug | ORAL_TABLET | Freq: Every day | ORAL | Status: AC

## 2015-01-01 MED ORDER — METRONIDAZOLE 500 MG PO TABS: 500.0000 mg | ORAL_TABLET | Freq: Three times a day (TID) | ORAL | Status: DC

## 2015-01-01 NOTE — Telephone Encounter (Signed)
-----   Message from Tresa Garter, MD sent at 12/25/2014  5:30 PM EDT ----- Please inform patient that her pelvic ultrasound showed no significant findings except for a small simple cyst in the right ovary. No evidence of fibroid or uterine enlargement.

## 2015-01-01 NOTE — Telephone Encounter (Signed)
-----   Message from Tresa Garter, MD sent at 12/25/2014  6:21 PM EDT ----- Please inform patient that her laboratory test results are normal except for her thyroid function which shows hypothyroidism (low thyroid function), we will start her on thyroid supplement. Pap smear is negative for malignancy, vaginal support is positive for bacterial vaginosis, not a sexually transmitted disease, treated with antibiotics.  Please call in prescription levothyroxine 75 g tablet by mouth daily, 90 tablets with 3 refills. Metronidazole 500 mg tablet by mouth twice a day for 7 days.

## 2015-01-01 NOTE — Telephone Encounter (Signed)
[place] in house interpreter used Patient is aware of her ultrasound results

## 2015-01-01 NOTE — Telephone Encounter (Signed)
Interpreter used-Margaret [name] Patient is aware of her lab results Prescriptions sent to [facility]

## 2015-01-11 ENCOUNTER — Ambulatory Visit: Payer: Self-pay

## 2015-02-24 ENCOUNTER — Ambulatory Visit: Payer: Self-pay | Attending: Internal Medicine

## 2015-05-13 ENCOUNTER — Encounter: Payer: Self-pay | Admitting: Internal Medicine

## 2015-05-13 ENCOUNTER — Ambulatory Visit: Payer: Self-pay | Attending: Internal Medicine | Admitting: Internal Medicine

## 2015-05-13 VITALS — BP 120/72 | HR 79 | Temp 98.3°F | Resp 18 | Ht 65.25 in | Wt 132.6 lb

## 2015-05-13 DIAGNOSIS — K029 Dental caries, unspecified: Secondary | ICD-10-CM | POA: Insufficient documentation

## 2015-05-13 DIAGNOSIS — N898 Other specified noninflammatory disorders of vagina: Secondary | ICD-10-CM | POA: Insufficient documentation

## 2015-05-13 DIAGNOSIS — H538 Other visual disturbances: Secondary | ICD-10-CM | POA: Insufficient documentation

## 2015-05-13 MED ORDER — METRONIDAZOLE 500 MG PO TABS: 500.0000 mg | ORAL_TABLET | Freq: Two times a day (BID) | ORAL | Status: DC

## 2015-05-13 MED ORDER — FLUCONAZOLE 200 MG PO TABS: 200.0000 mg | ORAL_TABLET | Freq: Once | ORAL | Status: DC
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
## 2015-05-14 LAB — CERVICOVAGINAL ANCILLARY ONLY
Chlamydia: NEGATIVE
NEISSERIA GONORRHEA: NEGATIVE
WET PREP (BD AFFIRM): NEGATIVE

## 2015-06-23 ENCOUNTER — Telehealth: Payer: Self-pay | Admitting: Internal Medicine

## 2015-06-23 NOTE — Telephone Encounter (Signed)
Patient came into facility to request her results from last OV, please f/u with pt.

## 2015-07-26 ENCOUNTER — Telehealth: Payer: Self-pay | Admitting: *Deleted

## 2015-07-26 NOTE — Telephone Encounter (Signed)
Pt. Returned call. Please f/u with pt. °

## 2015-07-26 NOTE — Telephone Encounter (Signed)
Medical Assistant used [employer] to contact  Interpreter Name: Renato Shin #: [phone]  Medical Assistant left message on patient's home and cell voicemail. Voicemail states to give a call back to [place] with [facility] at [phone].

## 2015-07-28 ENCOUNTER — Telehealth: Payer: Self-pay | Admitting: Internal Medicine

## 2015-07-28 NOTE — Telephone Encounter (Signed)
Patient presented to the clinic to obtain her results. Patient will be available today for a nurse to call her back with her results, if not tomorrow she can be reached anytime in the morning till 1pm or Friday you can call her anytime after 2:15pm. Please follow up with pt. Thank you.

## 2015-08-03 NOTE — Telephone Encounter (Signed)
Medical Assistant used [employer] to contact patient.  Interpreter Name: Interpreter #: [phone] Patient verified DOB  Medical Assistant left message on patient's home and cell voicemail. Voicemail states to give a call back to [place] with [facility] at [phone].

## 2015-08-06 NOTE — Telephone Encounter (Signed)
Pt. Came in requesting her results. Please f/u with pt.

## 2015-08-09 NOTE — Telephone Encounter (Signed)
Medical Assistant used [employer] to contact patient.  Interpreter Name: Lavada Mesi Interpreter #: [id number] Medical Assistant left message on patient's home and cell voicemail. Voicemail states to give a call back to [place] with [facility] at [phone].

## 2015-10-13 ENCOUNTER — Telehealth: Payer: Self-pay | Admitting: Internal Medicine

## 2015-10-13 NOTE — Telephone Encounter (Signed)
Pt. Came in requesting her results. Pt. Stated that she is available after 3 p.m. And on Thursdays she is available in the morning. Please f/u with pt.

## 2015-10-19 NOTE — Telephone Encounter (Signed)
Medical Assistant used [employer] to contact patient.  Interpreter Name: Inez Catalina Interpreter #: [phone]  Medical Assistant left message on patient's home and cell voicemail. Voicemail states to give a call back to [place] with [facility] at [phone].   Please inform patient of results being normal.

## 2015-10-25 NOTE — Telephone Encounter (Signed)
Pt. Came in today requesting her results. Please f/u with pt.

## 2015-10-25 NOTE — Telephone Encounter (Signed)
Patient informed in the office of all results being normal.

## 2015-11-30 ENCOUNTER — Emergency Department (HOSPITAL_COMMUNITY): Payer: No Typology Code available for payment source

## 2015-11-30 ENCOUNTER — Emergency Department (HOSPITAL_COMMUNITY)
Admission: EM | Admit: 2015-11-30 | Discharge: 2015-11-30 | Disposition: A | Discharge status: Home / Self Care | Payer: No Typology Code available for payment source | Attending: Emergency Medicine | Admitting: Emergency Medicine

## 2015-11-30 ENCOUNTER — Encounter (HOSPITAL_COMMUNITY): Payer: Self-pay | Admitting: Emergency Medicine

## 2015-11-30 DIAGNOSIS — Y998 Other external cause status: Secondary | ICD-10-CM | POA: Insufficient documentation

## 2015-11-30 DIAGNOSIS — Y92410 Unspecified street and highway as the place of occurrence of the external cause: Secondary | ICD-10-CM | POA: Insufficient documentation

## 2015-11-30 DIAGNOSIS — Y9389 Activity, other specified: Secondary | ICD-10-CM | POA: Insufficient documentation

## 2015-11-30 DIAGNOSIS — S3992XA Unspecified injury of lower back, initial encounter: Secondary | ICD-10-CM | POA: Diagnosis present

## 2015-11-30 DIAGNOSIS — S4992XA Unspecified injury of left shoulder and upper arm, initial encounter: Secondary | ICD-10-CM | POA: Diagnosis not present

## 2015-11-30 MED ORDER — OXYCODONE-ACETAMINOPHEN 5-325 MG PO TABS
2.0000 | ORAL_TABLET | Freq: Once | ORAL | Status: AC
  Administered 2015-11-30: 2 via ORAL
  Filled 2015-11-30: qty 2

## 2015-11-30 MED ORDER — METHOCARBAMOL 500 MG PO TABS: 500.0000 mg | ORAL_TABLET | Freq: Two times a day (BID) | ORAL | Status: DC

## 2015-11-30 MED ORDER — HYDROCODONE-ACETAMINOPHEN 5-325 MG PO TABS: 1.0000 | ORAL_TABLET | ORAL | Status: DC | PRN
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
## 2015-11-30 NOTE — ED Notes (Signed)
Pt back from x-ray.
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
## 2018-07-21 ENCOUNTER — Other Ambulatory Visit: Payer: Self-pay

## 2018-07-21 ENCOUNTER — Emergency Department (HOSPITAL_COMMUNITY)
Admission: EM | Admit: 2018-07-21 | Discharge: 2018-07-21 | Disposition: A | Discharge status: Home / Self Care | Payer: Worker's Compensation | Attending: Emergency Medicine | Admitting: Emergency Medicine

## 2018-07-21 ENCOUNTER — Encounter (HOSPITAL_COMMUNITY): Payer: Self-pay | Admitting: Emergency Medicine

## 2018-07-21 DIAGNOSIS — T22212A Burn of second degree of left forearm, initial encounter: Secondary | ICD-10-CM | POA: Insufficient documentation

## 2018-07-21 DIAGNOSIS — Y93G3 Activity, cooking and baking: Secondary | ICD-10-CM | POA: Diagnosis not present

## 2018-07-21 DIAGNOSIS — S59912A Unspecified injury of left forearm, initial encounter: Secondary | ICD-10-CM | POA: Diagnosis present

## 2018-07-21 DIAGNOSIS — Y92000 Kitchen of unspecified non-institutional (private) residence as  the place of occurrence of the external cause: Secondary | ICD-10-CM | POA: Insufficient documentation

## 2018-07-21 DIAGNOSIS — Z23 Encounter for immunization: Secondary | ICD-10-CM | POA: Insufficient documentation

## 2018-07-21 DIAGNOSIS — X102XXA Contact with fats and cooking oils, initial encounter: Secondary | ICD-10-CM | POA: Diagnosis not present

## 2018-07-21 DIAGNOSIS — Z79899 Other long term (current) drug therapy: Secondary | ICD-10-CM | POA: Insufficient documentation

## 2018-07-21 DIAGNOSIS — Y999 Unspecified external cause status: Secondary | ICD-10-CM | POA: Diagnosis not present

## 2018-07-21 DIAGNOSIS — E039 Hypothyroidism, unspecified: Secondary | ICD-10-CM | POA: Diagnosis not present

## 2018-07-21 MED ORDER — TETANUS-DIPHTH-ACELL PERTUSSIS 5-2.5-18.5 LF-MCG/0.5 IM SUSP
0.5000 mL | Freq: Once | INTRAMUSCULAR | Status: AC
  Administered 2018-07-21: 0.5 mL via INTRAMUSCULAR
  Filled 2018-07-21: qty 0.5

## 2018-07-21 MED ORDER — SILVER SULFADIAZINE 1 % EX CREA
TOPICAL_CREAM | Freq: Once | CUTANEOUS | Status: AC
  Administered 2018-07-21: 1 via TOPICAL
  Filled 2018-07-21: qty 85
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
## 2020-05-19 ENCOUNTER — Other Ambulatory Visit: Payer: Self-pay | Admitting: Obstetrics and Gynecology

## 2020-05-19 DIAGNOSIS — Z1231 Encounter for screening mammogram for malignant neoplasm of breast: Secondary | ICD-10-CM

## 2020-06-17 ENCOUNTER — Ambulatory Visit: Payer: No Typology Code available for payment source | Admitting: *Deleted

## 2020-06-17 ENCOUNTER — Ambulatory Visit
Admission: RE | Admit: 2020-06-17 | Discharge: 2020-06-17 | Disposition: A | Discharge status: Home / Self Care | Payer: No Typology Code available for payment source | Source: Ambulatory Visit | Attending: Obstetrics and Gynecology | Admitting: Obstetrics and Gynecology

## 2020-06-17 ENCOUNTER — Other Ambulatory Visit: Payer: Self-pay

## 2020-06-17 VITALS — BP 100/60 | Temp 97.5°F | Wt 137.5 lb

## 2020-06-17 DIAGNOSIS — Z1231 Encounter for screening mammogram for malignant neoplasm of breast: Secondary | ICD-10-CM

## 2020-06-17 DIAGNOSIS — Z1239 Encounter for other screening for malignant neoplasm of breast: Secondary | ICD-10-CM

## 2020-06-17 NOTE — Patient Instructions (Signed)
Explained breast self awareness with Margaret [name]. Patient did not need a Pap smear today due to last Pap smear and HPV typing was 05/14/2020. Let her know B[facility]CP will cover Pap smears and HPV typing every 5 years unless has a history of abnormal Pap smears. Referred patient to the [facility] for a screening mammogram. Appointment scheduled Thursday, June 17, 2020 at 1330. Patient escorted to the mobile unit for her mammogram. Let patient know the [facility] will follow up with her within the next couple weeks with results of her mammogram by letter or phone. Margaret [name] verbalized understanding.  Dj Senteno, Arvil Chaco, RN 12:59 PM

## 2020-06-17 NOTE — Progress Notes (Signed)
Ms. Dezzie Badilla Juliette Alcide is a 44 y.o. female who presents to [facility] clinic today with no complaints.    Pap Smear: Pap not smear completed today. Last Pap smear was 05/14/2020 at the [facility] clinic and was normal with negative HPV. Per patient has no history of an abnormal Pap smear. Last Pap smear result is not available in Epic. Pap smear result will be scanned into Epic.   Physical exam: Breasts Breasts symmetrical. No skin abnormalities bilateral breasts. No nipple retraction bilateral breasts. No nipple discharge bilateral breasts. No lymphadenopathy. No lumps palpated bilateral breasts. No complaints of pain or tenderness on exam.       Pelvic/Bimanual Pap is not indicated today per B[facility]CP guidelines.    Smoking History: Patient has never smoked.   Patient Navigation: Patient education provided. Access to services provided for patient through [facility]. Spanish interpreter Rudene Anda from [facility] provided.    Breast and Cervical Cancer Risk Assessment: Patient does not have family history of breast cancer, known genetic mutations, or radiation treatment to the chest before age 5. Patient does not have history of cervical dysplasia, immunocompromised, or DES exposure in-utero.  Risk Assessment    Risk Scores      06/17/2020   Last edited by: Demetrius Revel, LPN   5-year risk: 0.5 %   Lifetime risk: 6.2 %          A: B[facility]CP exam without pap smear No complaints.  P: Referred patient to the [facility] for a screening mammogram. Appointment scheduled Thursday, June 17, 2020 at 1330.  Loletta Parish, RN 06/17/2020 12:59 PM

## 2020-06-21 ENCOUNTER — Other Ambulatory Visit: Payer: Self-pay | Admitting: Obstetrics and Gynecology

## 2020-06-21 DIAGNOSIS — R928 Other abnormal and inconclusive findings on diagnostic imaging of breast: Secondary | ICD-10-CM

## 2020-06-29 ENCOUNTER — Other Ambulatory Visit: Payer: Self-pay

## 2020-06-29 ENCOUNTER — Ambulatory Visit
Admission: RE | Admit: 2020-06-29 | Discharge: 2020-06-29 | Disposition: A | Discharge status: Home / Self Care | Payer: No Typology Code available for payment source | Source: Ambulatory Visit | Attending: Obstetrics and Gynecology | Admitting: Obstetrics and Gynecology

## 2020-06-29 DIAGNOSIS — R928 Other abnormal and inconclusive findings on diagnostic imaging of breast: Secondary | ICD-10-CM

## 2021-07-17 IMAGING — MG MM DIGITAL DIAGNOSTIC UNILAT*R* W/ TOMO W/ CAD
4 series · 4 of 12 positions shown · non-contrast
Comparison: Previous exam(s).

CLINICAL DATA: Patient returns after baseline study for evaluation
of possible RIGHT breast asymmetry.

EXAM:
DIGITAL DIAGNOSTIC RIGHT MAMMOGRAM WITH CAD AND TOMO
ULTRASOUND RIGHT BREAST

[R MLO synth-2D]
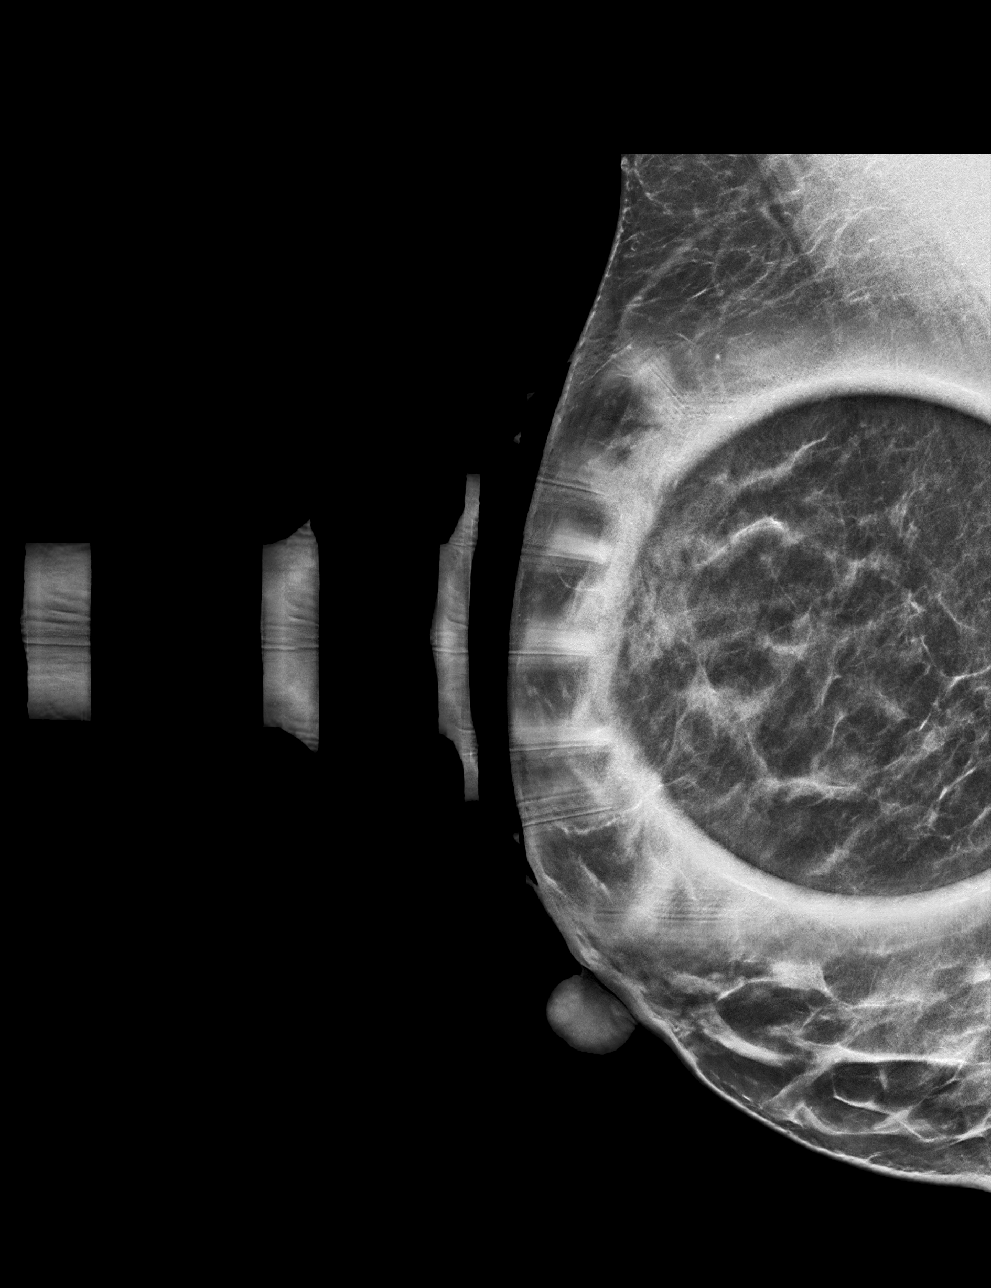

[R ML synth-2D]
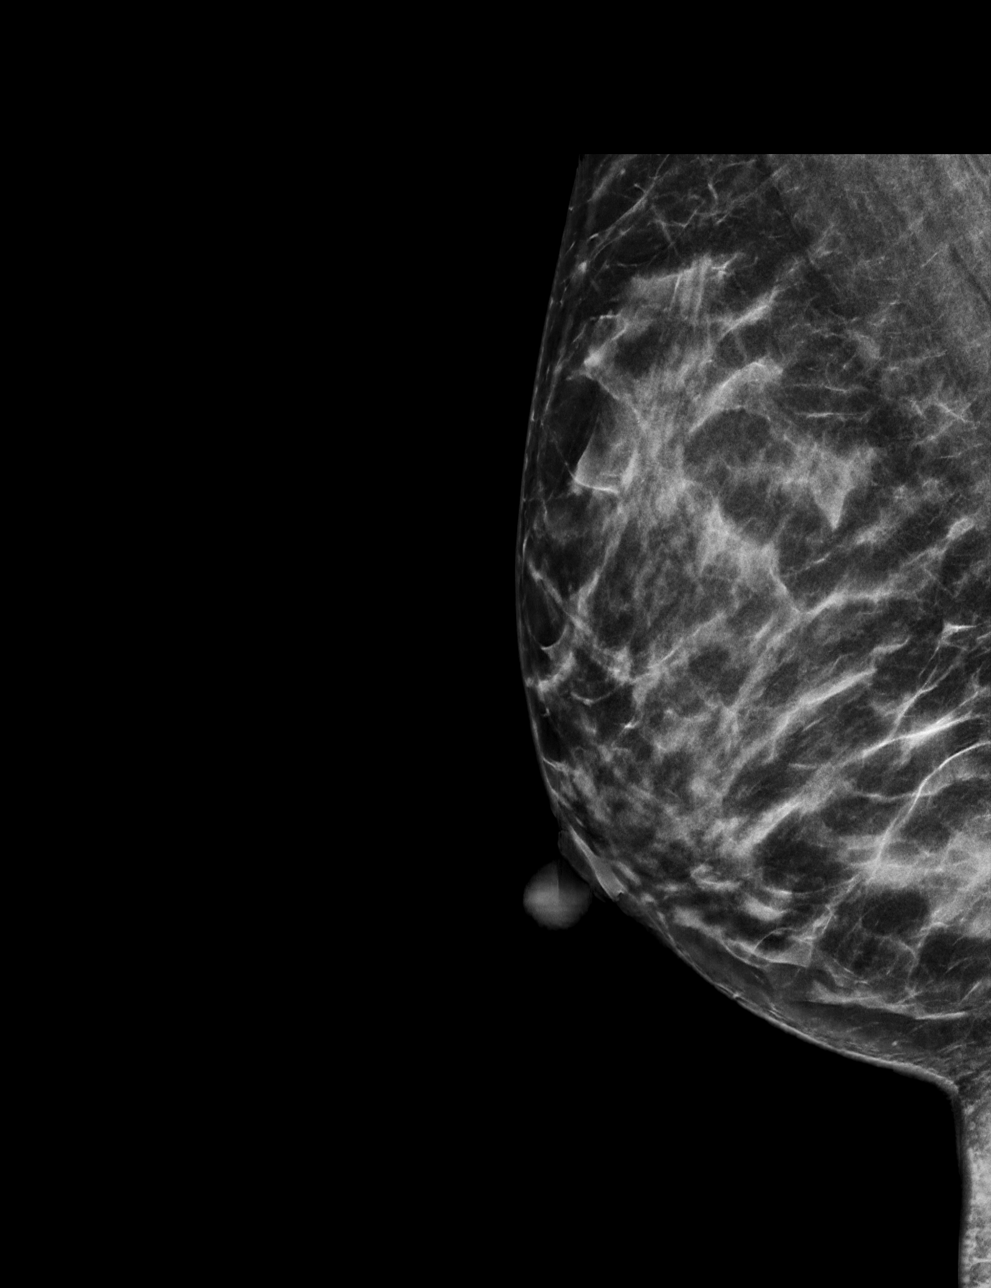

[R MLO tomo · tomo slice 28/55.0]
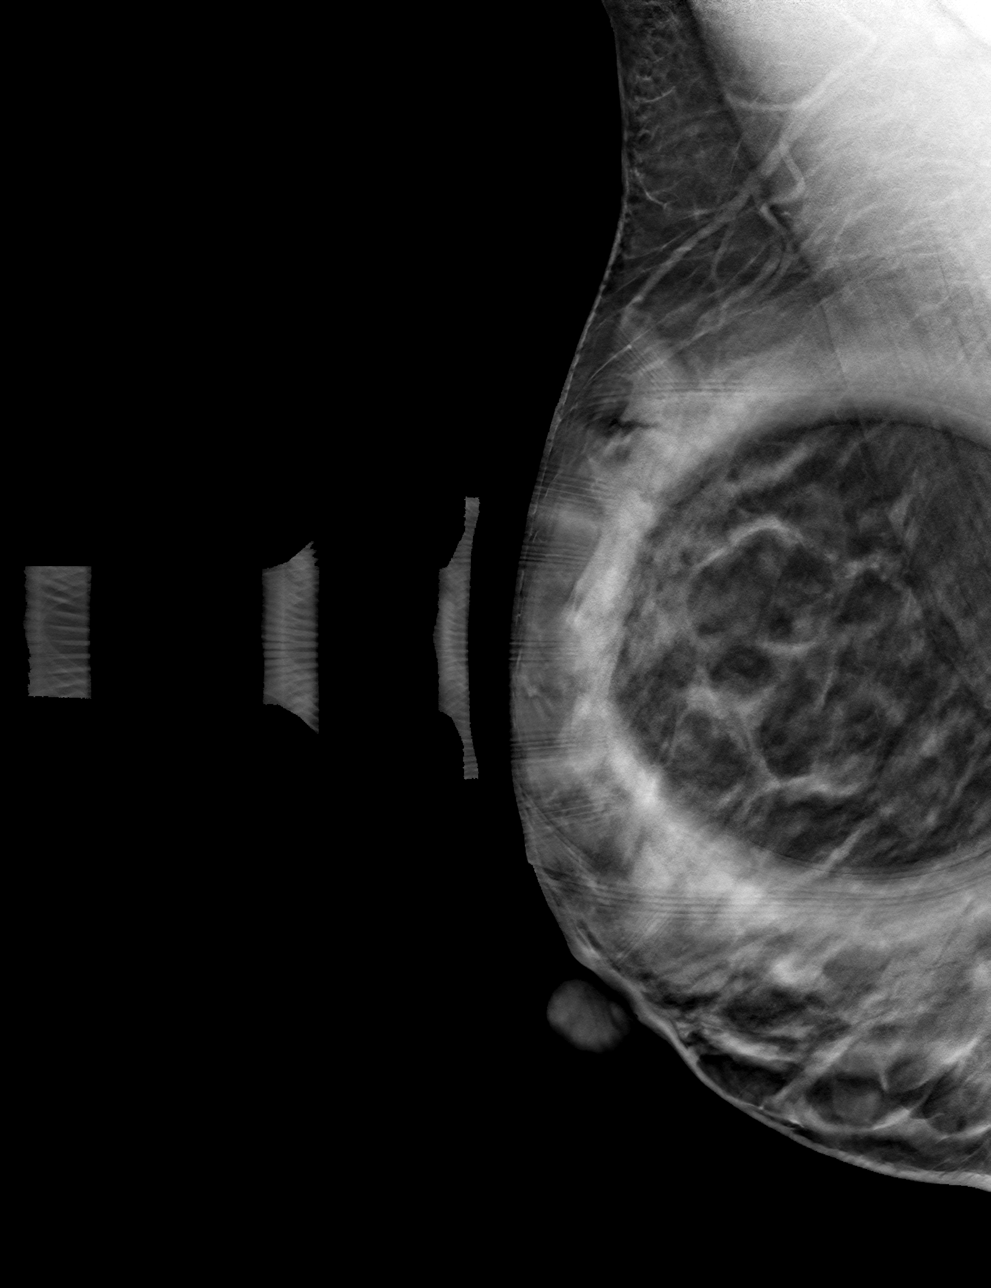

[R ML tomo · tomo slice 30/59.0]
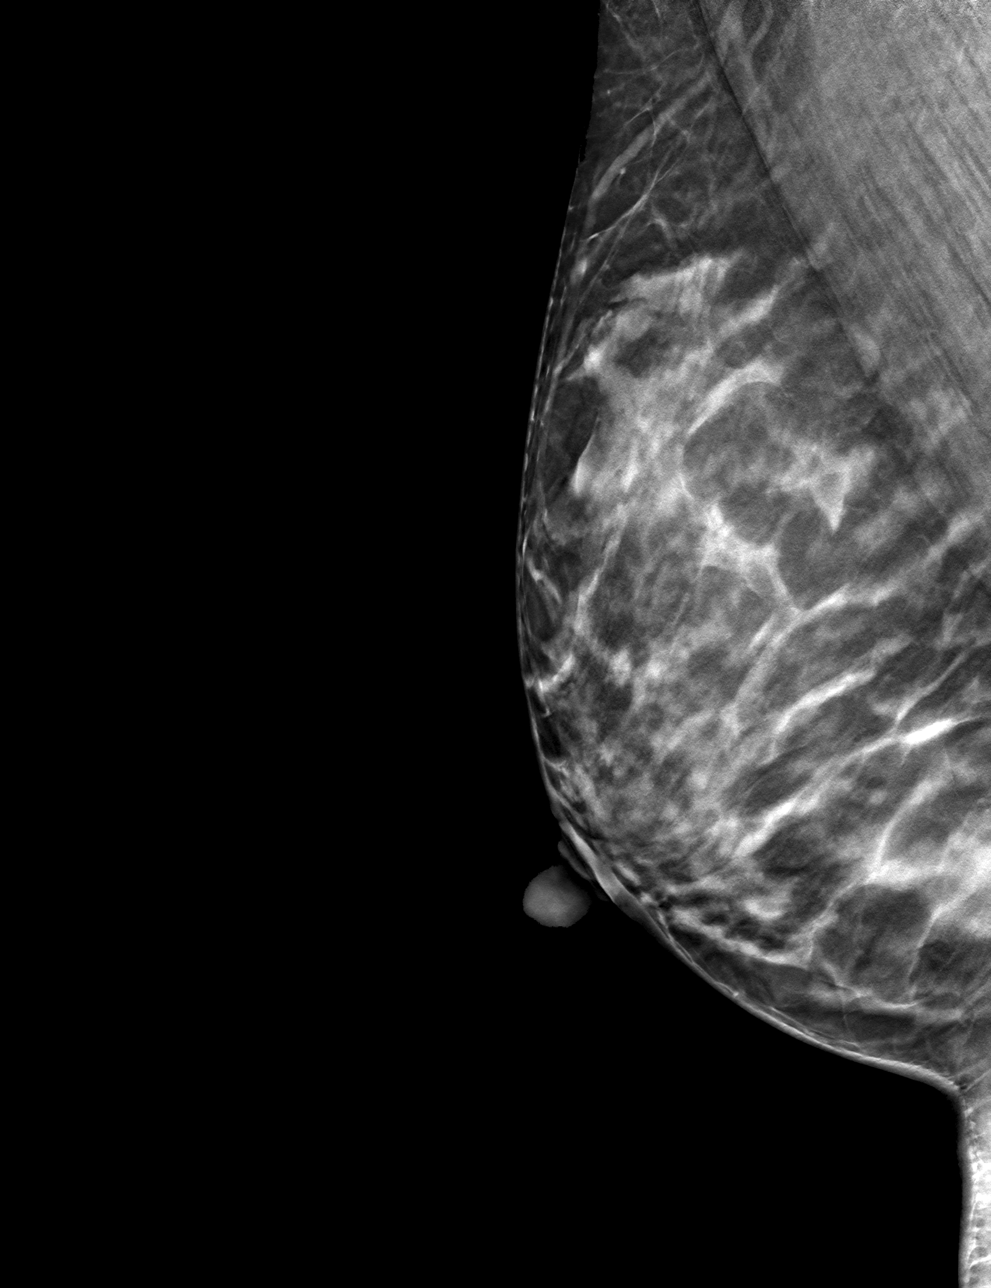

[4 of 12 positions shown; findings below may reference images not displayed]

ACR Breast Density Category c: The breast tissue is heterogeneously
dense, which may obscure small masses.
FINDINGS: Additional 2-D and 3-D images are performed. These views show no
persistent abnormality in the superior portion of the RIGHT breast.

Mammographic images were processed with CAD.

Targeted ultrasound is performed, showing dense fibroglandular
tissue throughout the UPPER OUTER QUADRANT of the RIGHT breast.
There is a small group of benign cysts in the 11 o'clock location 5
centimeters from the nipple which measures 1.1 x 0.9 x
centimeters. There is no solid mass or acoustic shadowing.
IMPRESSION: No mammographic or ultrasound evidence for malignancy.

Focal fibrocystic change in the 11 o'clock location of the RIGHT
breast.

RECOMMENDATION:
Screening mammogram in one year.(Code:[id number])

I have discussed the findings and recommendations with the patient
with the assistance of an interpreter. If applicable, a reminder
letter will be sent to the patient regarding the next appointment.

BI-RADS CATEGORY  2: Benign.

## 2021-11-07 ENCOUNTER — Ambulatory Visit (HOSPITAL_COMMUNITY)
Admission: EM | Admit: 2021-11-07 | Discharge: 2021-11-07 | Disposition: A | Discharge status: Home / Self Care | Payer: Self-pay | Attending: Sports Medicine | Admitting: Sports Medicine

## 2021-11-07 ENCOUNTER — Encounter (HOSPITAL_COMMUNITY): Payer: Self-pay

## 2021-11-07 ENCOUNTER — Other Ambulatory Visit: Payer: Self-pay

## 2021-11-07 DIAGNOSIS — H60313 Diffuse otitis externa, bilateral: Secondary | ICD-10-CM

## 2021-11-07 MED ORDER — CIPROFLOXACIN-DEXAMETHASONE 0.3-0.1 % OT SUSP: 4.0000 [drp] | Freq: Two times a day (BID) | OTIC | 0 refills | Status: AC

## 2021-11-07 NOTE — ED Triage Notes (Signed)
Pt presents with bilateral ear pain X 1 month that initially started as itching and progressed to pain.

## 2021-11-07 NOTE — ED Provider Notes (Signed)
[facility]    CSN: [id number] Arrival date & time: 11/07/21  4696      History   Chief Complaint Chief Complaint  Patient presents with   Otalgia    HPI Margaret [name] is a 46 y.o. female here for left otalgia.   Otalgia Associated symptoms: headaches   Associated symptoms: no congestion, no fever, no rash and no sore throat    Patient presents with her daughter who does help provide some of HPI. Left ear - started with pruritis x few weeks Used some OTC medicine but not helpful Now more swollen and painful Headache on that side now No discharge but feels clogged Feels like water is in ear Diminished hearing, muffled hearing Does not use Q-tips Otherwise feeling well without sore throat, runny nose, chest pain or shortness of breath Right ear has started to get somewhat itchy as well  History reviewed. No pertinent past medical history.  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      Home Medications    Prior to Admission medications   Medication Sig Start Date End Date Taking? Authorizing Provider  ciprofloxacin-dexamethasone (CIPRODEX) OTIC suspension Place 4 drops into both ears 2 (two) times daily for 7 days. 11/07/21 11/14/21 Yes Elba Barman, DO  cyclobenzaprine (FLEXERIL) 10 MG tablet Take 1 tablet (10 mg total) by mouth 2 (two) times daily as needed for muscle spasms. Patient not taking: Reported on 12/14/2014  04/21/14   Alvina Chou, PA-C  fluconazole (DIFLUCAN) 200 MG tablet Take 1 tablet (200 mg total) by mouth once. Patient not taking: Reported on 11/30/2015 05/13/15   Tresa Garter, MD  folic acid (FOLVITE) 1 MG tablet Take 1 mg by mouth daily.    [provider]  HYDROcodone-acetaminophen (NORCO/VICODIN) 5-325 MG tablet Take 1-2 tablets by mouth every 4 (four) hours as needed. 11/30/15   Lacretia Leigh, MD  ibuprofen (ADVIL,MOTRIN) 200 MG tablet Take 400 mg by mouth every 6 (six) hours as needed (pain).    [provider]  levothyroxine (SYNTHROID, LEVOTHROID) 75 MCG tablet Take 1 tablet (75 mcg total) by mouth daily. Patient not taking: Reported on 05/13/2015 01/01/15   Lorayne Marek, MD  methocarbamol (ROBAXIN) 500 MG tablet Take 1 tablet (500 mg total) by mouth 2 (two) times daily. 11/30/15   Lacretia Leigh, MD  metroNIDAZOLE (FLAGYL) 500 MG tablet Take 1 tablet (500 mg total) by mouth 2 (two) times daily. Patient not taking: Reported on 11/30/2015 05/13/15   Tresa Garter, MD    Family History Family History  Problem Relation Age of Onset   Hypertension Mother    Diabetes Father 74   Diabetes Sister    Diabetes Brother     Social History Social History   Tobacco Use   Smoking status: Never   Smokeless tobacco: Never  Vaping Use   Vaping Use: Never used  Substance Use Topics   Alcohol use: No  Drug use: Never     Allergies   Patient has no known allergies.   Review of Systems Review of Systems  Constitutional:  Negative for chills and fever.  HENT:  Positive for ear pain. Negative for congestion and sore throat.   Respiratory:  Negative for shortness of breath.   Cardiovascular:  Negative for chest pain.  Skin:  Negative for rash.  Neurological:  Positive for headaches.    Physical Exam Triage Vital Signs ED Triage Vitals  Enc Vitals Group     BP 11/07/21 0846 (!) 117/53     Pulse Rate 11/07/21 0846 77     Resp 11/07/21 0846 17      Temp 11/07/21 0846 98 F (36.7 C)     Temp Source 11/07/21 0846 Oral     SpO2 11/07/21 0846 98 %     Weight --      Height --      Head Circumference --      Peak Flow --      Pain Score 11/07/21 0848 7     Pain Loc --      Pain Edu? --      Excl. in [place]? --    No data found.  Updated Vital Signs BP (!) 117/53 (BP Location: Left Arm)    Pulse 77    Temp 98 F (36.7 C) (Oral)    Resp 17    LMP  (LMP Unknown)    SpO2 98%    Physical Exam Constitutional:      General: She is not in acute distress.    Appearance: Normal appearance. She is not toxic-appearing.  HENT:     Head: Normocephalic and atraumatic.     Right Ear: Tympanic membrane normal.     Left Ear: Tympanic membrane normal.     Ears:     Comments: Right ear: erythematous canal Left ear: erythematous canal, white discharge, edema within canal    Nose: Nose normal.  Eyes:     Extraocular Movements: Extraocular movements intact.     Conjunctiva/sclera: Conjunctivae normal.     Pupils: Pupils are equal, round, and reactive to light.  Cardiovascular:     Rate and Rhythm: Normal rate.  Pulmonary:     Effort: Pulmonary effort is normal.  Skin:    Capillary Refill: Capillary refill takes less than 2 seconds.  Neurological:     Mental Status: She is alert.  Psychiatric:        Mood and Affect: Mood normal.        Thought Content: Thought content normal.     UC Treatments / Results  Labs (all labs ordered are listed, but only abnormal results are displayed) Labs Reviewed - No data to display  EKG   Radiology No results found.  Procedures Procedures (including critical care time)  Medications Ordered in UC Medications - No data to display  Initial Impression / Assessment and Plan / UC Course  I have reviewed the triage vital signs and the nursing notes.  Pertinent labs & imaging results that were available during my care of the patient were reviewed by me and considered in my medical decision making  (see chart for details).     Otitis Externa, bilateral: L > R  Patient with a few weeks of progressive worsening otitis externa of the left ear.  The left ear canal is erythematous, swelling and there is discharge within the left ear.  The right ear canal does appear erythematous  with some mild pruritus, likely earlier stage of otitis externa on this side.  No treatment thus far.  Given the likely bacterial infection and the edema within the canal, we will treat with Ciprodex, 4 drops into each ear BID for the next 7 days.  Discussed not putting anything into the ears to clean.  She may use over-the-counter Tylenol and/or ibuprofen for analgesic control.  Handout on otitis externa provided for patient in terms of at home care.  May continue the drops up to 10 days for the left ear if not improving.  Return precautions provided.  Follow-up with PCP otherwise.  Final Clinical Impressions(s) / UC Diagnoses   Final diagnoses:  Acute diffuse otitis externa of both ears     Discharge Instructions      Ciprodex ear drops --> use in both ears twice a day for 7 days.     ED Prescriptions     Medication Sig Dispense Auth. Provider   ciprofloxacin-dexamethasone (CIPRODEX) OTIC suspension Place 4 drops into both ears 2 (two) times daily for 7 days. 2.8 mL Elba Barman, DO      PDMP not reviewed this encounter.   Elba Barman, [place] 11/07/21 [phone]

## 2021-11-07 NOTE — Discharge Instructions (Addendum)
Ciprodex ear drops --> use in both ears twice a day for 7 days.

## 2022-03-19 ENCOUNTER — Other Ambulatory Visit: Payer: Self-pay

## 2022-03-19 ENCOUNTER — Emergency Department (HOSPITAL_COMMUNITY)
Admission: EM | Admit: 2022-03-19 | Discharge: 2022-03-20 | Disposition: A | Discharge status: Home / Self Care | Payer: Self-pay | Attending: Emergency Medicine | Admitting: Emergency Medicine

## 2022-03-19 ENCOUNTER — Encounter (HOSPITAL_COMMUNITY): Payer: Self-pay | Admitting: Emergency Medicine

## 2022-03-19 DIAGNOSIS — R1031 Right lower quadrant pain: Secondary | ICD-10-CM | POA: Insufficient documentation

## 2022-03-19 DIAGNOSIS — R102 Pelvic and perineal pain: Secondary | ICD-10-CM | POA: Insufficient documentation

## 2022-03-19 DIAGNOSIS — D259 Leiomyoma of uterus, unspecified: Secondary | ICD-10-CM | POA: Insufficient documentation

## 2022-03-19 DIAGNOSIS — R110 Nausea: Secondary | ICD-10-CM | POA: Insufficient documentation

## 2022-03-19 DIAGNOSIS — R7309 Other abnormal glucose: Secondary | ICD-10-CM | POA: Insufficient documentation

## 2022-03-19 LAB — URINALYSIS, ROUTINE W REFLEX MICROSCOPIC
Bilirubin Urine: NEGATIVE
Glucose, UA: NEGATIVE mg/dL
Hgb urine dipstick: NEGATIVE
Ketones, ur: NEGATIVE mg/dL
Leukocytes,Ua: NEGATIVE
Nitrite: NEGATIVE
Protein, ur: NEGATIVE mg/dL
Specific Gravity, Urine: 1.013 (ref 1.005–1.030)
pH: 7 (ref 5.0–8.0)

## 2022-03-19 LAB — CBC
HCT: 41.6 % (ref 36.0–46.0)
Hemoglobin: 13.9 g/dL (ref 12.0–15.0)
MCH: 30.3 pg (ref 26.0–34.0)
MCHC: 33.4 g/dL (ref 30.0–36.0)
MCV: 90.6 fL (ref 80.0–100.0)
Platelets: 244 10*3/uL (ref 150–400)
RBC: 4.59 MIL/uL (ref 3.87–5.11)
RDW: 12.3 % (ref 11.5–15.5)
WBC: 7.1 10*3/uL (ref 4.0–10.5)
nRBC: 0 % (ref 0.0–0.2)

## 2022-03-19 LAB — I-STAT BETA HCG BLOOD, ED (MC, WL, AP ONLY): I-stat hCG, quantitative: <5 m[IU]/mL (ref <5)

## 2022-03-19 LAB — COMPREHENSIVE METABOLIC PANEL
ALT: 44 U/L (ref 0–44)
AST: 42 U/L — ABNORMAL HIGH (ref 15–41)
Albumin: 4 g/dL (ref 3.5–5.0)
Alkaline Phosphatase: 81 U/L (ref 38–126)
Anion gap: 12 (ref 5–15)
BUN: 6 mg/dL (ref 6–20)
CO2: 22 mmol/L (ref 22–32)
Calcium: 9.2 mg/dL (ref 8.9–10.3)
Chloride: 102 mmol/L (ref 98–111)
Creatinine, Ser: 0.71 mg/dL (ref 0.44–1.00)
GFR, Estimated: >60 mL/min (ref >60)
Glucose, Bld: 122 mg/dL — ABNORMAL HIGH (ref 70–99)
Potassium: 3.9 mmol/L (ref 3.5–5.1)
Sodium: 136 mmol/L (ref 135–145)
Total Bilirubin: 0.8 mg/dL (ref 0.3–1.2)
Total Protein: 7.3 g/dL (ref 6.5–8.1)

## 2022-03-20 ENCOUNTER — Emergency Department (HOSPITAL_COMMUNITY): Payer: Self-pay

## 2022-03-20 LAB — WET PREP, GENITAL
Clue Cells Wet Prep HPF POC: NONE SEEN
Sperm: NONE SEEN
Trich, Wet Prep: NONE SEEN
WBC, Wet Prep HPF POC: <10 (ref <10)
Yeast Wet Prep HPF POC: NONE SEEN

## 2022-03-20 LAB — GC/CHLAMYDIA PROBE AMP (~~LOC~~) NOT AT ARMC
Chlamydia: NEGATIVE
Comment: NEGATIVE
Comment: NORMAL
Neisseria Gonorrhea: NEGATIVE

## 2022-03-20 LAB — HIV ANTIBODY (ROUTINE TESTING W REFLEX): HIV Screen 4th Generation wRfx: NONREACTIVE

## 2022-03-20 LAB — RPR: RPR Ser Ql: NONREACTIVE

## 2022-03-20 MED ORDER — METRONIDAZOLE 500 MG PO TABS: 500.0000 mg | ORAL_TABLET | Freq: Two times a day (BID) | ORAL | 0 refills | Status: AC

## 2022-03-20 MED ORDER — CEFTRIAXONE SODIUM 500 MG IJ SOLR
500.0000 mg | Freq: Once | INTRAMUSCULAR | Status: AC
  Administered 2022-03-20: 500 mg via INTRAMUSCULAR
  Filled 2022-03-20: qty 500

## 2022-03-20 MED ORDER — METRONIDAZOLE 500 MG PO TABS
500.0000 mg | ORAL_TABLET | Freq: Once | ORAL | Status: AC
  Administered 2022-03-20: 500 mg via ORAL
  Filled 2022-03-20: qty 1

## 2022-03-20 MED ORDER — IBUPROFEN 400 MG PO TABS
400.0000 mg | ORAL_TABLET | Freq: Once | ORAL | Status: AC
  Administered 2022-03-20: 400 mg via ORAL
  Filled 2022-03-20: qty 1

## 2022-03-20 MED ORDER — DOXYCYCLINE HYCLATE 100 MG PO TABS
100.0000 mg | ORAL_TABLET | Freq: Once | ORAL | Status: AC
  Administered 2022-03-20: 100 mg via ORAL
  Filled 2022-03-20: qty 1

## 2022-03-20 MED ORDER — DOXYCYCLINE HYCLATE 100 MG PO CAPS: 100.0000 mg | ORAL_CAPSULE | Freq: Two times a day (BID) | ORAL | 0 refills | Status: AC

## 2022-03-20 MED ORDER — STERILE WATER FOR INJECTION IJ SOLN
INTRAMUSCULAR | Status: AC
  Administered 2022-03-20: 10 mL
  Filled 2022-03-20: qty 10

## 2023-02-12 ENCOUNTER — Ambulatory Visit (HOSPITAL_COMMUNITY)
Admission: EM | Admit: 2023-02-12 | Discharge: 2023-02-12 | Disposition: A | Discharge status: Home / Self Care | Payer: Self-pay

## 2023-02-12 ENCOUNTER — Encounter (HOSPITAL_COMMUNITY): Payer: Self-pay | Admitting: Emergency Medicine

## 2023-02-12 ENCOUNTER — Other Ambulatory Visit: Payer: Self-pay

## 2023-02-12 DIAGNOSIS — N814 Uterovaginal prolapse, unspecified: Secondary | ICD-10-CM

## 2023-02-12 NOTE — Discharge Instructions (Signed)
You have a cystocele This is not an infection. This is a movement of your bladder that falls into the vaginal wall due to excessive pressure or weak pelvic muscles. You can go to physical therapy to work on strengthening of the muscles, there are insert of devices that can help with the musculature. You can get surgery to suspect the bladder. Please follow-up with your gynecologist to discuss further treatment options.

## 2023-02-12 NOTE — ED Triage Notes (Addendum)
Went to a wom[facility] clinic on [address] and told she had a vaginal cyst.  Has noticed pain with ambulation and has had vaginal bleeding for 2 weeks.  Bleeding did stop 1-2 days ago.  Has had lower abdominal pain .

## 2023-02-12 NOTE — ED Provider Notes (Signed)
[facility]    CSN: [id number] Arrival date & time: 02/12/23  1058      History   Chief Complaint No chief complaint on file.   HPI Margaret [name] is a 47 y.o. female.   Pleasant 47 year old female presents today due to concerns of a cyst in the vaginal area.  She was seen by a women's clinic roughly 3 months ago and was told that she had a cyst. Pt uncertain what kind. She was given some type of pill that she states did not work.  She was also told that the cyst would go away spontaneously or resolve.  Patient states that it is not gone away and is actually getting worse.  Anytime she has excessive ambulation if she feels like the cyst increases in size.  Over the past 2 weeks she unfortunately has also noticed some bleeding from that area.  She denies any discharge or drainage apart from the blood.  She reports increase in urination when the area is swollen, but denies any dysuria or hesitancy. Denies pelvic pain, fever.     History reviewed. No pertinent past medical history.  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      Home Medications    Prior to Admission medications   Medication Sig Start Date End Date Taking? Authorizing Provider  acetaminophen (TYLENOL) 500 MG tablet Take 1,000 mg by mouth every 6 (six) hours as needed for headache or moderate pain.    [provider]  doxycycline (VIBRAMYCIN) 100 MG capsule  Take 1 capsule (100 mg total) by mouth 2 (two) times daily. Patient not taking: Reported on 02/12/2023 03/20/22   Dione Booze, MD  levothyroxine (SYNTHROID, LEVOTHROID) 75 MCG tablet Take 1 tablet (75 mcg total) by mouth daily. Patient not taking: Reported on 05/13/2015 01/01/15   Doris Cheadle, MD  metroNIDAZOLE (FLAGYL) 500 MG tablet Take 1 tablet (500 mg total) by mouth 2 (two) times daily. Patient not taking: Reported on 02/12/2023 03/20/22   Dione Booze, MD    Family History Family History  Problem Relation Age of Onset   Hypertension Mother    Diabetes Father 72   Diabetes Sister    Diabetes Brother     Social History Social History   Tobacco Use   Smoking status: Never   Smokeless tobacco: Never  Vaping Use   Vaping Use: Never used  Substance Use Topics   Alcohol use: No   Drug use: Never     Allergies   Patient has no known allergies.   Review of Systems Review of Systems As per HPI  Physical Exam Triage Vital Signs ED Triage Vitals  Enc Vitals Group     BP 02/12/23 1322 121/82     Pulse Rate 02/12/23 1322 69     Resp 02/12/23 1322 18     Temp 02/12/23 1322 98.3 F (36.8 C)     Temp Source 02/12/23 1322 Oral  SpO2 02/12/23 1322 98 %     Weight --      Height --      Head Circumference --      Peak Flow --      Pain Score 02/12/23 1320 5     Pain Loc --      Pain Edu? --      Excl. in GC? --    No data found.  Updated Vital Signs BP 121/82 (BP Location: Left Arm)   Pulse 69   Temp 98.3 F (36.8 C) (Oral)   Resp 18   LMP 01/29/2023   SpO2 98%   Visual Acuity Right Eye Distance:   Left Eye Distance:   Bilateral Distance:    Right Eye Near:   Left Eye Near:    Bilateral Near:     Physical Exam Vitals and nursing note reviewed. Exam conducted with a chaperone present.  Constitutional:      Appearance: Normal appearance. She is well-developed and normal weight.  HENT:     Head: Normocephalic.  Cardiovascular:     Rate and Rhythm:  Normal rate.     Heart sounds: No murmur heard. Pulmonary:     Effort: Pulmonary effort is normal. No respiratory distress.     Breath sounds: No wheezing.  Abdominal:     General: Abdomen is flat. Bowel sounds are normal. There is no distension. There are no signs of injury.     Palpations: Abdomen is soft. There is no shifting dullness, fluid wave, hepatomegaly, splenomegaly, mass or pulsatile mass.     Tenderness: There is no abdominal tenderness. There is no right CVA tenderness, left CVA tenderness, guarding or rebound. Negative signs include Murphy's sign, Rovsing's sign, McBurney's sign, psoas sign and obturator sign.     Hernia: No hernia is present.  Genitourinary:    General: Normal vulva.     Pubic Area: No rash or pubic lice.      Labia:        Right: No rash, tenderness, lesion or injury.        Left: No rash, tenderness, lesion or injury.      Urethra: No prolapse, urethral pain, urethral swelling or urethral lesion.     Vagina: No signs of injury and foreign body. No vaginal discharge, erythema, tenderness, bleeding, lesions or prolapsed vaginal walls.     Cervix: No cervical motion tenderness, discharge, friability, lesion, erythema or eversion.     Uterus: Normal. Not deviated, not enlarged, not fixed, not tender and no uterine prolapse.      Adnexa: Right adnexa normal and left adnexa normal.       Right: No mass, tenderness or fullness.         Left: No mass, tenderness or fullness.       Rectum: Normal.     Comments: Upon valsalva, pt has a grade 2 cystocele.  No other cysts, lesions or abnormalities noted Neurological:     Mental Status: She is alert.      UC Treatments / Results  Labs (all labs ordered are listed, but only abnormal results are displayed) Labs Reviewed - No data to display  EKG   Radiology No results found.  Procedures Procedures (including critical care time)  Medications Ordered in UC Medications - No data to display  Initial  Impression / Assessment and Plan / UC Course  I have reviewed the triage vital signs and the nursing notes.  Pertinent labs & imaging results that  were available during my care of the patient were reviewed by me and considered in my medical decision making (see chart for details).     Cystocele with grade 1 prolapse - discussed with pt and daughter that there was no actual "cyst" but rather her bladder was falling into the vaginal area and prolapsing. It is currently at a grade 2, almost grade 3. Recommended pelvic floor therapies to start, and to follow up with gynecology for further evaluation and management options. (Ie: pessary, surgery, intone, etc)   Final Clinical Impressions(s) / UC Diagnoses   Final diagnoses:  Cystocele with prolapse     Discharge Instructions      You have a cystocele This is not an infection. This is a movement of your bladder that falls into the vaginal wall due to excessive pressure or weak pelvic muscles. You can go to physical therapy to work on strengthening of the muscles, there are insert of devices that can help with the musculature. You can get surgery to suspect the bladder. Please follow-up with your gynecologist to discuss further treatment options.     ED Prescriptions   None    PDMP not reviewed this encounter.   Maretta Bees, [place] 02/13/23 [phone]
